# Patient Record
Sex: Male | Born: 1975
Health system: Southern US, Community
[De-identification: ages and names within clinical notes are randomized; demographics above are authoritative.]

## PROBLEM LIST (undated history)

## (undated) DIAGNOSIS — N201 Calculus of ureter: Secondary | ICD-10-CM

## (undated) DIAGNOSIS — F411 Generalized anxiety disorder: Secondary | ICD-10-CM

## (undated) DIAGNOSIS — F410 Panic disorder [episodic paroxysmal anxiety] without agoraphobia: Secondary | ICD-10-CM

## (undated) HISTORY — DX: Panic disorder (episodic paroxysmal anxiety): F41.0

## (undated) HISTORY — DX: Panic disorder (episodic paroxysmal anxiety): F41.1

---

## 2006-10-28 ENCOUNTER — Emergency Department (HOSPITAL_COMMUNITY): Admission: EM | Admit: 2006-10-28 | Discharge: 2006-10-28 | Payer: Self-pay | Admitting: Emergency Medicine

## 2009-10-30 ENCOUNTER — Ambulatory Visit: Payer: Self-pay | Admitting: Diagnostic Radiology

## 2009-10-30 ENCOUNTER — Emergency Department (HOSPITAL_BASED_OUTPATIENT_CLINIC_OR_DEPARTMENT_OTHER): Admission: EM | Admit: 2009-10-30 | Discharge: 2009-10-30 | Payer: Self-pay | Admitting: Emergency Medicine

## 2010-12-01 LAB — COMPREHENSIVE METABOLIC PANEL
AST: 21 U/L (ref 0–37)
BUN: 20 mg/dL (ref 6–23)
CO2: 28 mEq/L (ref 19–32)
Creatinine, Ser: 1.1 mg/dL (ref 0.4–1.5)
Potassium: 4.7 mEq/L (ref 3.5–5.1)

## 2010-12-01 LAB — DIFFERENTIAL
Basophils Absolute: 0 10*3/uL (ref 0.0–0.1)
Basophils Relative: 0 % (ref 0–1)
Eosinophils Absolute: 0 10*3/uL (ref 0.0–0.7)
Monocytes Absolute: 0.5 10*3/uL (ref 0.1–1.0)
Neutro Abs: 7.1 10*3/uL (ref 1.7–7.7)

## 2010-12-01 LAB — CBC
Hemoglobin: 16.3 g/dL (ref 13.0–17.0)
MCHC: 35.2 g/dL (ref 30.0–36.0)
MCV: 96.4 fL (ref 78.0–100.0)
Platelets: 258 10*3/uL (ref 150–400)
RBC: 4.8 MIL/uL (ref 4.22–5.81)
WBC: 8.8 10*3/uL (ref 4.0–10.5)

## 2010-12-01 LAB — LIPASE, BLOOD: Lipase: 140 U/L (ref 23–300)

## 2010-12-01 LAB — URINE MICROSCOPIC-ADD ON

## 2010-12-01 LAB — URINALYSIS, ROUTINE W REFLEX MICROSCOPIC
Nitrite: NEGATIVE
pH: 5.5 (ref 5.0–8.0)

## 2015-01-11 ENCOUNTER — Ambulatory Visit: Payer: Self-pay

## 2015-01-11 ENCOUNTER — Other Ambulatory Visit: Payer: Self-pay | Admitting: Occupational Medicine

## 2015-01-11 DIAGNOSIS — M25571 Pain in right ankle and joints of right foot: Secondary | ICD-10-CM

## 2016-05-18 DIAGNOSIS — H0015 Chalazion left lower eyelid: Secondary | ICD-10-CM | POA: Diagnosis not present

## 2016-06-08 DIAGNOSIS — H0015 Chalazion left lower eyelid: Secondary | ICD-10-CM | POA: Diagnosis not present

## 2016-11-17 DIAGNOSIS — N200 Calculus of kidney: Secondary | ICD-10-CM | POA: Diagnosis not present

## 2016-11-17 DIAGNOSIS — M545 Low back pain: Secondary | ICD-10-CM | POA: Diagnosis not present

## 2016-11-17 DIAGNOSIS — R319 Hematuria, unspecified: Secondary | ICD-10-CM | POA: Diagnosis not present

## 2016-11-17 MED FILL — HYDROCODON-APAP 5-325: 5-325 | 4 days supply | Qty: 30 | Fill #0

## 2016-11-17 MED FILL — TAMSULOSIN HCL 0.4 MG CAP: 0.4 | 15 days supply | Qty: 15 | Fill #0

## 2017-10-25 DIAGNOSIS — H00022 Hordeolum internum right lower eyelid: Secondary | ICD-10-CM | POA: Diagnosis not present

## 2018-01-28 DIAGNOSIS — F419 Anxiety disorder, unspecified: Secondary | ICD-10-CM | POA: Diagnosis not present

## 2018-01-28 DIAGNOSIS — F439 Reaction to severe stress, unspecified: Secondary | ICD-10-CM | POA: Diagnosis not present

## 2018-01-28 DIAGNOSIS — G4700 Insomnia, unspecified: Secondary | ICD-10-CM | POA: Diagnosis not present

## 2018-01-28 MED FILL — traZODone HCL 50 MG TABS: 50 | 30 days supply | Qty: 30 | Fill #0

## 2018-01-28 MED FILL — buPROPion HCL ER (XL) 150 M: 150 | 30 days supply | Qty: 30 | Fill #0

## 2018-02-25 DIAGNOSIS — F439 Reaction to severe stress, unspecified: Secondary | ICD-10-CM | POA: Diagnosis not present

## 2018-02-25 DIAGNOSIS — F419 Anxiety disorder, unspecified: Secondary | ICD-10-CM | POA: Diagnosis not present

## 2018-02-25 DIAGNOSIS — G4700 Insomnia, unspecified: Secondary | ICD-10-CM | POA: Diagnosis not present

## 2018-02-25 MED FILL — buPROPion HCL ER (XL) 150 M: 150 | 90 days supply | Qty: 90 | Fill #0

## 2018-02-25 MED FILL — traZODone HCL 50 MG TABS: 50 | 90 days supply | Qty: 90 | Fill #0

## 2018-03-03 ENCOUNTER — Observation Stay (HOSPITAL_COMMUNITY): Payer: 59

## 2018-03-03 ENCOUNTER — Other Ambulatory Visit: Payer: Self-pay

## 2018-03-03 ENCOUNTER — Observation Stay (HOSPITAL_COMMUNITY): Payer: 59 | Admitting: Certified Registered Nurse Anesthetist

## 2018-03-03 ENCOUNTER — Encounter (HOSPITAL_COMMUNITY)
Admission: EM | Disposition: A | Discharge status: Home / Self Care | Payer: Self-pay | Source: Home / Self Care | Attending: Urology

## 2018-03-03 ENCOUNTER — Encounter (HOSPITAL_BASED_OUTPATIENT_CLINIC_OR_DEPARTMENT_OTHER): Payer: Self-pay | Admitting: *Deleted

## 2018-03-03 ENCOUNTER — Observation Stay (HOSPITAL_BASED_OUTPATIENT_CLINIC_OR_DEPARTMENT_OTHER)
Admission: EM | Admit: 2018-03-03 | Discharge: 2018-03-03 | Disposition: A | Discharge status: Home / Self Care | Payer: 59 | Attending: Urology | Admitting: Urology

## 2018-03-03 ENCOUNTER — Emergency Department (HOSPITAL_BASED_OUTPATIENT_CLINIC_OR_DEPARTMENT_OTHER): Payer: 59

## 2018-03-03 DIAGNOSIS — N132 Hydronephrosis with renal and ureteral calculous obstruction: Principal | ICD-10-CM | POA: Insufficient documentation

## 2018-03-03 DIAGNOSIS — R1031 Right lower quadrant pain: Secondary | ICD-10-CM | POA: Diagnosis not present

## 2018-03-03 DIAGNOSIS — Z6830 Body mass index (BMI) 30.0-30.9, adult: Secondary | ICD-10-CM | POA: Insufficient documentation

## 2018-03-03 DIAGNOSIS — N201 Calculus of ureter: Secondary | ICD-10-CM | POA: Diagnosis present

## 2018-03-03 DIAGNOSIS — E669 Obesity, unspecified: Secondary | ICD-10-CM | POA: Diagnosis not present

## 2018-03-03 DIAGNOSIS — R109 Unspecified abdominal pain: Secondary | ICD-10-CM | POA: Diagnosis not present

## 2018-03-03 HISTORY — PX: CYSTOSCOPY/URETEROSCOPY/HOLMIUM LASER/STENT PLACEMENT: SHX6546

## 2018-03-03 HISTORY — DX: Calculus of ureter: N20.1

## 2018-03-03 LAB — URINALYSIS, ROUTINE W REFLEX MICROSCOPIC
BILIRUBIN URINE: NEGATIVE
Glucose, UA: NEGATIVE mg/dL
KETONES UR: NEGATIVE mg/dL
LEUKOCYTES UA: NEGATIVE
NITRITE: NEGATIVE
PROTEIN: NEGATIVE mg/dL
Specific Gravity, Urine: >1.03 — ABNORMAL HIGH (ref 1.005–1.030)
pH: 6 (ref 5.0–8.0)

## 2018-03-03 LAB — BASIC METABOLIC PANEL
ANION GAP: 10 (ref 5–15)
BUN: 19 mg/dL (ref 6–20)
CALCIUM: 9.3 mg/dL (ref 8.9–10.3)
CO2: 28 mmol/L (ref 22–32)
CREATININE: 1.31 mg/dL — AB (ref 0.61–1.24)
Chloride: 106 mmol/L (ref 101–111)
GFR calc Af Amer: >60 mL/min (ref >60)
Glucose, Bld: 143 mg/dL — ABNORMAL HIGH (ref 65–99)
Potassium: 4.5 mmol/L (ref 3.5–5.1)
Sodium: 144 mmol/L (ref 135–145)

## 2018-03-03 LAB — CBC WITH DIFFERENTIAL/PLATELET
BASOS ABS: 0 10*3/uL (ref 0.0–0.1)
BASOS PCT: 0 %
EOS ABS: 0.1 10*3/uL (ref 0.0–0.7)
EOS PCT: 0 %
HCT: 45.1 % (ref 39.0–52.0)
Hemoglobin: 15.9 g/dL (ref 13.0–17.0)
Lymphocytes Relative: 14 %
Lymphs Abs: 2.1 10*3/uL (ref 0.7–4.0)
MCH: 34 pg (ref 26.0–34.0)
MCHC: 35.3 g/dL (ref 30.0–36.0)
MCV: 96.4 fL (ref 78.0–100.0)
MONO ABS: 1 10*3/uL (ref 0.1–1.0)
Monocytes Relative: 6 %
NEUTROS ABS: 11.8 10*3/uL — AB (ref 1.7–7.7)
Neutrophils Relative %: 80 %
PLATELETS: 313 10*3/uL (ref 150–400)
RBC: 4.68 MIL/uL (ref 4.22–5.81)
RDW: 11.6 % (ref 11.5–15.5)
WBC: 14.9 10*3/uL — ABNORMAL HIGH (ref 4.0–10.5)

## 2018-03-03 LAB — URINALYSIS, MICROSCOPIC (REFLEX): BACTERIA UA: NONE SEEN

## 2018-03-03 LAB — SURGICAL PCR SCREEN
MRSA, PCR: NEGATIVE
Staphylococcus aureus: NEGATIVE

## 2018-03-03 SURGERY — CYSTOSCOPY/URETEROSCOPY/HOLMIUM LASER/STENT PLACEMENT
Anesthesia: General | Site: Ureter | Laterality: Left

## 2018-03-03 MED ORDER — MUPIROCIN 2 % EX OINT: 1.0000 "application " | TOPICAL_OINTMENT | Freq: Two times a day (BID) | CUTANEOUS | Status: DC

## 2018-03-03 MED ORDER — FENTANYL CITRATE (PF) 100 MCG/2ML IJ SOLN
50.0000 ug | Freq: Once | INTRAMUSCULAR | Status: AC
  Administered 2018-03-03: 50 ug via INTRAVENOUS
  Filled 2018-03-03: qty 2

## 2018-03-03 MED ORDER — KETOROLAC TROMETHAMINE 15 MG/ML IJ SOLN
15.0000 mg | Freq: Four times a day (QID) | INTRAMUSCULAR | Status: DC
  Administered 2018-03-03 (×2): 15 mg via INTRAVENOUS
  Filled 2018-03-03 (×2): qty 1

## 2018-03-03 MED ORDER — CEFAZOLIN SODIUM-DEXTROSE 2-4 GM/100ML-% IV SOLN
INTRAVENOUS | Status: AC
  Filled 2018-03-03: qty 100

## 2018-03-03 MED ORDER — OXYCODONE HCL 5 MG PO TABS: 5.0000 mg | ORAL_TABLET | ORAL | Status: DC | PRN

## 2018-03-03 MED ORDER — HYDROMORPHONE HCL 1 MG/ML IJ SOLN
1.0000 mg | Freq: Once | INTRAMUSCULAR | Status: AC
  Administered 2018-03-03: 1 mg via INTRAVENOUS
  Filled 2018-03-03: qty 1

## 2018-03-03 MED ORDER — CEFAZOLIN SODIUM-DEXTROSE 2-3 GM-%(50ML) IV SOLR
INTRAVENOUS | Status: DC | PRN
  Administered 2018-03-03: 2 g via INTRAVENOUS

## 2018-03-03 MED ORDER — LIDOCAINE 2% (20 MG/ML) 5 ML SYRINGE
INTRAMUSCULAR | Status: DC | PRN
  Administered 2018-03-03: 100 mg via INTRAVENOUS

## 2018-03-03 MED ORDER — ONDANSETRON HCL 4 MG/2ML IJ SOLN
INTRAMUSCULAR | Status: DC | PRN
Start: 2018-03-03 — End: 2018-03-03
  Administered 2018-03-03: 4 mg via INTRAVENOUS

## 2018-03-03 MED ORDER — DEXAMETHASONE SODIUM PHOSPHATE 10 MG/ML IJ SOLN
INTRAMUSCULAR | Status: DC | PRN
  Administered 2018-03-03: 10 mg via INTRAVENOUS

## 2018-03-03 MED ORDER — SENNOSIDES-DOCUSATE SODIUM 8.6-50 MG PO TABS: 1.0000 | ORAL_TABLET | Freq: Two times a day (BID) | ORAL | 0 refills | Status: DC

## 2018-03-03 MED ORDER — OXYCODONE-ACETAMINOPHEN 5-325 MG PO TABS: 1.0000 | ORAL_TABLET | ORAL | 0 refills | Status: AC | PRN

## 2018-03-03 MED ORDER — HYDROMORPHONE HCL 1 MG/ML IJ SOLN: 0.2500 mg | INTRAMUSCULAR | Status: DC | PRN

## 2018-03-03 MED ORDER — ONDANSETRON HCL 4 MG/2ML IJ SOLN
4.0000 mg | Freq: Once | INTRAMUSCULAR | Status: AC
  Administered 2018-03-03: 4 mg via INTRAVENOUS
  Filled 2018-03-03: qty 2

## 2018-03-03 MED ORDER — SUGAMMADEX SODIUM 200 MG/2ML IV SOLN
INTRAVENOUS | Status: DC | PRN
  Administered 2018-03-03: 200 mg via INTRAVENOUS

## 2018-03-03 MED ORDER — PROPOFOL 10 MG/ML IV BOLUS
INTRAVENOUS | Status: AC
  Filled 2018-03-03: qty 20

## 2018-03-03 MED ORDER — ACETAMINOPHEN 500 MG PO TABS
1000.0000 mg | ORAL_TABLET | Freq: Three times a day (TID) | ORAL | Status: DC
  Administered 2018-03-03: 1000 mg via ORAL
  Filled 2018-03-03: qty 2

## 2018-03-03 MED ORDER — DEXAMETHASONE SODIUM PHOSPHATE 10 MG/ML IJ SOLN
INTRAMUSCULAR | Status: AC
  Filled 2018-03-03: qty 1

## 2018-03-03 MED ORDER — LACTATED RINGERS IV SOLN
INTRAVENOUS | Status: DC
  Administered 2018-03-03: 11:00:00 via INTRAVENOUS

## 2018-03-03 MED ORDER — SUCCINYLCHOLINE CHLORIDE 200 MG/10ML IV SOSY
PREFILLED_SYRINGE | INTRAVENOUS | Status: DC | PRN
  Administered 2018-03-03: 120 mg via INTRAVENOUS

## 2018-03-03 MED ORDER — ONDANSETRON HCL 4 MG/2ML IJ SOLN
INTRAMUSCULAR | Status: AC
  Filled 2018-03-03: qty 2

## 2018-03-03 MED ORDER — SODIUM CHLORIDE 0.9 % IR SOLN
Status: DC | PRN
  Administered 2018-03-03 (×2): 3000 mL

## 2018-03-03 MED ORDER — FENTANYL CITRATE (PF) 250 MCG/5ML IJ SOLN
INTRAMUSCULAR | Status: AC
  Filled 2018-03-03: qty 5

## 2018-03-03 MED ORDER — CEPHALEXIN 500 MG PO CAPS: 500.0000 mg | ORAL_CAPSULE | Freq: Two times a day (BID) | ORAL | 0 refills | Status: DC

## 2018-03-03 MED ORDER — SODIUM CHLORIDE 0.9 % IV SOLN
INTRAVENOUS | Status: DC | PRN
  Administered 2018-03-03: 10 mL

## 2018-03-03 MED ORDER — MIDAZOLAM HCL 2 MG/2ML IJ SOLN
INTRAMUSCULAR | Status: AC
  Filled 2018-03-03: qty 2

## 2018-03-03 MED ORDER — FENTANYL CITRATE (PF) 100 MCG/2ML IJ SOLN
INTRAMUSCULAR | Status: DC | PRN
  Administered 2018-03-03 (×3): 50 ug via INTRAVENOUS

## 2018-03-03 MED ORDER — KCL IN DEXTROSE-NACL 20-5-0.45 MEQ/L-%-% IV SOLN
INTRAVENOUS | Status: DC
  Administered 2018-03-03: 07:00:00 via INTRAVENOUS
  Filled 2018-03-03: qty 1000

## 2018-03-03 MED ORDER — PROPOFOL 10 MG/ML IV BOLUS
INTRAVENOUS | Status: DC | PRN
  Administered 2018-03-03: 200 mg via INTRAVENOUS

## 2018-03-03 MED ORDER — HYDROMORPHONE HCL 1 MG/ML IJ SOLN: 0.5000 mg | INTRAMUSCULAR | Status: DC | PRN

## 2018-03-03 MED ORDER — LIDOCAINE 2% (20 MG/ML) 5 ML SYRINGE
INTRAMUSCULAR | Status: AC
  Filled 2018-03-03: qty 5

## 2018-03-03 MED ORDER — SENNOSIDES-DOCUSATE SODIUM 8.6-50 MG PO TABS: 1.0000 | ORAL_TABLET | Freq: Two times a day (BID) | ORAL | Status: DC

## 2018-03-03 MED ORDER — ONDANSETRON HCL 4 MG/2ML IJ SOLN: 4.0000 mg | Freq: Three times a day (TID) | INTRAMUSCULAR | Status: AC | PRN

## 2018-03-03 MED ORDER — ROCURONIUM BROMIDE 50 MG/5ML IV SOSY
PREFILLED_SYRINGE | INTRAVENOUS | Status: DC | PRN
  Administered 2018-03-03: 40 mg via INTRAVENOUS
  Administered 2018-03-03: 10 mg via INTRAVENOUS
  Administered 2018-03-03: 20 mg via INTRAVENOUS
  Administered 2018-03-03: 10 mg via INTRAVENOUS

## 2018-03-03 MED ORDER — KETOROLAC TROMETHAMINE 10 MG PO TABS: 10.0000 mg | ORAL_TABLET | Freq: Four times a day (QID) | ORAL | 0 refills | Status: DC | PRN

## 2018-03-03 MED ORDER — PROMETHAZINE HCL 25 MG/ML IJ SOLN: 6.2500 mg | INTRAMUSCULAR | Status: DC | PRN

## 2018-03-03 MED ORDER — SODIUM CHLORIDE 0.9 % IV SOLN
INTRAVENOUS | Status: AC
  Administered 2018-03-03: 05:00:00 via INTRAVENOUS

## 2018-03-03 MED ORDER — HYDROMORPHONE HCL 1 MG/ML IJ SOLN
1.0000 mg | INTRAMUSCULAR | Status: AC | PRN
  Administered 2018-03-03 (×3): 1 mg via INTRAVENOUS
  Filled 2018-03-03 (×4): qty 1

## 2018-03-03 SURGICAL SUPPLY — 26 items
BAG URO CATCHER STRL LF (MISCELLANEOUS) ×3 IMPLANT
BASKET LASER NITINOL 1.9FR (BASKET) ×3 IMPLANT
CATH INTERMIT  6FR 70CM (CATHETERS) ×3 IMPLANT
CLOTH BEACON ORANGE TIMEOUT ST (SAFETY) ×3 IMPLANT
COVER FOOTSWITCH UNIV (MISCELLANEOUS) ×3 IMPLANT
COVER SURGICAL LIGHT HANDLE (MISCELLANEOUS) ×3 IMPLANT
EXTRACTOR STONE 1.7FRX115CM (UROLOGICAL SUPPLIES) IMPLANT
FIBER LASER FLEXIVA 1000 (UROLOGICAL SUPPLIES) IMPLANT
FIBER LASER FLEXIVA 365 (UROLOGICAL SUPPLIES) IMPLANT
FIBER LASER FLEXIVA 550 (UROLOGICAL SUPPLIES) IMPLANT
FIBER LASER TRAC TIP (UROLOGICAL SUPPLIES) ×3 IMPLANT
GLOVE BIOGEL M STRL SZ7.5 (GLOVE) ×3 IMPLANT
GOWN STRL REUS W/TWL LRG LVL3 (GOWN DISPOSABLE) ×6 IMPLANT
GUIDEWIRE ANG ZIPWIRE 038X150 (WIRE) ×3 IMPLANT
GUIDEWIRE STR DUAL SENSOR (WIRE) ×3 IMPLANT
IV NS 1000ML (IV SOLUTION) ×2
IV NS 1000ML BAXH (IV SOLUTION) ×1 IMPLANT
MANIFOLD NEPTUNE II (INSTRUMENTS) ×3 IMPLANT
PACK CYSTO (CUSTOM PROCEDURE TRAY) ×3 IMPLANT
SHEATH URETERAL 12FRX28CM (UROLOGICAL SUPPLIES) IMPLANT
SHEATH URETERAL 12FRX35CM (MISCELLANEOUS) ×3 IMPLANT
STENT POLARIS 5FRX26 (STENTS) ×3 IMPLANT
SYR CONTROL 10ML LL (SYRINGE) ×3 IMPLANT
TUBE FEEDING 8FR 16IN STR KANG (MISCELLANEOUS) ×3 IMPLANT
TUBING CONNECTING 10 (TUBING) ×2 IMPLANT
TUBING CONNECTING 10' (TUBING) ×1

## 2018-03-03 NOTE — ED Provider Notes (Signed)
[facility] Provider Note: Lowella Dell, MD, FACEP  CSN: [id number] MRN: [id number] ARRIVAL: 03/03/18 at 0143 ROOM: MH02/MH02   CHIEF COMPLAINT  Flank Pain   HISTORY OF PRESENT ILLNESS  03/03/18 2:20 AM Randy Scott is a 42 y.o. male with a history of kidney stones.  He is here with left flank pain that began about 2 weeks ago off and on but acutely worsened yesterday evening at 10 PM.  He rates his pain as severe and characterizes it is like previous kidney stones.  The pain is not made better or worse with movement or palpation.  He took a Vicodin prior to arrival but vomited it up.  He has had multiple episodes of emesis since the onset of the pain.  He has had increased urinary frequency with passage of small amounts.  He has not noticed blood in his urine.  He has not had a fever.  He was given Zofran 4 mg IV and fentanyl 50 mcg IV prior to my evaluation with out adequate relief of his pain.   Past Medical History:  Diagnosis Date  . Ureterolithiasis     History reviewed. No pertinent surgical history.  History reviewed. No pertinent family history.  Social History   Tobacco Use  . Smoking status: Never Smoker  . Smokeless tobacco: Never Used  Substance Use Topics  . Alcohol use: Yes    Frequency: Never  . Drug use: Never    Prior to Admission medications   Not on File    Allergies Patient has no known allergies.   REVIEW OF SYSTEMS  Negative except as noted here or in the History of Present Illness.   PHYSICAL EXAMINATION  Initial Vital Signs Blood pressure (!) 152/76, pulse (!) 55, temperature 98 F (36.7 C), temperature source Oral, resp. rate 18, height 6\' 2"  (1.88 m), weight 104.3 kg (230 lb), SpO2 100 %.  Examination General: Well-developed, well-nourished male in no acute distress; appearance consistent with age of record HENT: normocephalic; atraumatic Eyes: pupils equal, round and reactive to light; extraocular muscles  intact Neck: supple Heart: regular rate and rhythm Lungs: clear to auscultation bilaterally Abdomen: soft; nondistended; nontender; bowel sounds present GU: No CVA tenderness Extremities: No deformity; full range of motion; pulses normal Neurologic: Awake, alert and oriented; motor function intact in all extremities and symmetric; no facial droop Skin: Warm and dry Psychiatric: Grimacing   RESULTS  Summary of this visit's results, reviewed by myself:   EKG Interpretation  Date/Time:    Ventricular Rate:    PR Interval:    QRS Duration:   QT Interval:    QTC Calculation:   R Axis:     Text Interpretation:        Laboratory Studies: Results for orders placed or performed during the hospital encounter of 03/03/18 (from the past 24 hour(s))  Basic metabolic panel     Status: Abnormal   Collection Time: 03/03/18  1:58 AM  Result Value Ref Range   Sodium 144 135 - 145 mmol/L   Potassium 4.5 3.5 - 5.1 mmol/L   Chloride 106 101 - 111 mmol/L   CO2 28 22 - 32 mmol/L   Glucose, Bld 143 (H) 65 - 99 mg/dL   BUN 19 6 - 20 mg/dL   Creatinine, Ser 7.82 (H) 0.61 - 1.24 mg/dL   Calcium 9.3 8.9 - 95.6 mg/dL   GFR calc non Af Amer >60 >60 mL/min   GFR calc Af Amer >60 >60  mL/min   Anion gap 10 5 - 15  CBC with Differential     Status: Abnormal   Collection Time: 03/03/18  1:58 AM  Result Value Ref Range   WBC 14.9 (H) 4.0 - 10.5 K/uL   RBC 4.68 4.22 - 5.81 MIL/uL   Hemoglobin 15.9 13.0 - 17.0 g/dL   HCT 09.845.1 11.939.0 - 14.752.0 %   MCV 96.4 78.0 - 100.0 fL   MCH 34.0 26.0 - 34.0 pg   MCHC 35.3 30.0 - 36.0 g/dL   RDW 82.911.6 56.211.5 - 13.015.5 %   Platelets 313 150 - 400 K/uL   Neutrophils Relative % 80 %   Neutro Abs 11.8 (H) 1.7 - 7.7 K/uL   Lymphocytes Relative 14 %   Lymphs Abs 2.1 0.7 - 4.0 K/uL   Monocytes Relative 6 %   Monocytes Absolute 1.0 0.1 - 1.0 K/uL   Eosinophils Relative 0 %   Eosinophils Absolute 0.1 0.0 - 0.7 K/uL   Basophils Relative 0 %   Basophils Absolute 0.0 0.0 - 0.1  K/uL  Urinalysis, Routine w reflex microscopic     Status: Abnormal   Collection Time: 03/03/18  1:58 AM  Result Value Ref Range   Color, Urine YELLOW YELLOW   APPearance CLEAR CLEAR   Specific Gravity, Urine >1.030 (H) 1.005 - 1.030   pH 6.0 5.0 - 8.0   Glucose, UA NEGATIVE NEGATIVE mg/dL   Hgb urine dipstick SMALL (A) NEGATIVE   Bilirubin Urine NEGATIVE NEGATIVE   Ketones, ur NEGATIVE NEGATIVE mg/dL   Protein, ur NEGATIVE NEGATIVE mg/dL   Nitrite NEGATIVE NEGATIVE   Leukocytes, UA NEGATIVE NEGATIVE  Urinalysis, Microscopic (reflex)     Status: None   Collection Time: 03/03/18  1:58 AM  Result Value Ref Range   RBC / HPF 6-10 0 - 5 RBC/hpf   WBC, UA 0-5 0 - 5 WBC/hpf   Bacteria, UA NONE SEEN NONE SEEN   Squamous Epithelial / LPF 0-5 0 - 5   Mucus PRESENT    Imaging Studies: Ct Renal Stone Study  Result Date: 03/03/2018 CLINICAL DATA:  Intermittent left flank pain EXAM: CT ABDOMEN AND PELVIS WITHOUT CONTRAST TECHNIQUE: Multidetector CT imaging of the abdomen and pelvis was performed following the standard protocol without IV contrast. COMPARISON:  CT 10/30/2009 FINDINGS: Lower chest: No acute abnormality. Hepatobiliary: No focal liver abnormality is seen. No gallstones, gallbladder wall thickening, or biliary dilatation. Pancreas: Unremarkable. No pancreatic ductal dilatation or surrounding inflammatory changes. Spleen: Normal in size without focal abnormality. Adrenals/Urinary Tract: Adrenal glands are normal. No right hydronephrosis. Moderate left hydronephrosis and hydroureter, secondary to a 3 mm stone in the distal left ureter just proximal to the left UVJ. Additional 11 mm stone in the dilated left renal pelvis. Stomach/Bowel: Stomach is within normal limits. Appendix appears normal. No evidence of bowel wall thickening, distention, or inflammatory changes. Vascular/Lymphatic: Minimal aortic atherosclerosis. No aneurysmal dilatation. No significant adenopathy Reproductive: Prostate  is unremarkable. Other: Negative for free air or free fluid. Musculoskeletal: No acute or significant osseous findings. IMPRESSION: 1. Moderate left hydronephrosis and hydroureter, secondary to a 3 mm stone in the distal left ureter just proximal to the left UVJ. 2. 11 mm stone in the left renal pelvis. Electronically Signed   By: Jasmine PangKim  Fujinaga M.D.   On: 03/03/2018 03:04    ED COURSE and MDM  Nursing notes and initial vitals signs, including pulse oximetry, reviewed.  Vitals:   03/03/18 0147 03/03/18 0148 03/03/18 0301 03/03/18 0330  BP: (!) 152/76  (!) 150/84   Pulse: (!) 55  62 84  Resp: 18  14   Temp: 98 F (36.7 C)     TempSrc: Oral     SpO2: 100%  97% 99%  Weight:  104.3 kg (230 lb)    Height:  6\' 2"  (1.88 m)     3:27 AM Patient has been given 100 mcg of fentanyl and 2 mg of Dilaudid.  He is somnolent but complains of continued pain.  Given his persistent pain and complex pathology as shown on CT we will contact urology.  3:47 AM Dr. Berneice Heinrich accepts patient for transfer to [facility].  He plans to perform surgery tomorrow.  PROCEDURES   CRITICAL CARE Performed by: Paula Libra L Total critical care time: 30 minutes Critical care time was exclusive of separately billable procedures and treating other patients. Critical care was necessary to treat or prevent imminent or life-threatening deterioration. Critical care was time spent personally by me on the following activities: development of treatment plan with patient and/or surrogate as well as nursing, discussions with consultants, evaluation of patient's response to treatment, examination of patient, obtaining history from patient or surrogate, ordering and performing treatments and interventions, ordering and review of laboratory studies, ordering and review of radiographic studies, pulse oximetry and re-evaluation of patient's condition.   ED DIAGNOSES     ICD-10-CM   1. Ureterolithiasis N20.1        Shakima Nisley,  MD 03/03/18 [phone]

## 2018-03-03 NOTE — Brief Op Note (Signed)
03/03/2018  1:30 PM  PATIENT:  Randy Scott  42 y.o. male  PRE-OPERATIVE DIAGNOSIS:  left ureteral stone  POST-OPERATIVE DIAGNOSIS:  left ureteral stone  PROCEDURE:  Procedure(s): CYSTOSCOPY/URETEROSCOPY/HOLMIUM LASER/STONE REMOVAL WITH BASKET/STENT PLACEMENT (Left)  SURGEON:  Surgeon(s) and Role:    Sebastian Ache* Keyly Baldonado, MD - Primary  PHYSICIAN ASSISTANT:   ASSISTANTS: none   ANESTHESIA:   general  EBL:  minimal   BLOOD ADMINISTERED:none  DRAINS: none   LOCAL MEDICATIONS USED:  NONE  SPECIMEN:  Source of Specimen:  LEFT ureteral / renal stone fragments  DISPOSITION OF SPECIMEN:  [facility] for compositional analysis  COUNTS:  YES  TOURNIQUET:  * No tourniquets in log *  DICTATION: .Other Dictation: Dictation Number [id number]  PLAN OF CARE: Admit for overnight observation  PATIENT DISPOSITION:  PACU - hemodynamically stable.   Delay start of Pharmacological VTE agent (>24hrs) due to surgical blood loss or risk of bleeding: yes

## 2018-03-03 NOTE — Op Note (Signed)
NAME: Randy Scott, HANDLER MEDICAL RECORD ZO:[id number] ACCOUNT [id number] DATE OF BIRTH:1976/01/05 FACILITY: [facility] LOCATION: [facility] PHYSICIAN:Jasani Lengel, MD  OPERATIVE REPORT  DATE OF PROCEDURE:  03/03/2018  PREOPERATIVE  DIAGNOSIS:  Left ureteral and renal stone refractory colic.  PROCEDURE: 1.  Cystoscopy, left retrograde pyelogram, interpretation. 2.  Left ureteroscopy with laser lithotripsy and extraction of left ureteral stent 5 x 26 Polaris with tether.  ESTIMATED BLOOD LOSS:  Nil.  COMPLICATIONS:  None.  SPECIMENS:  Left ureteral and renal stone fragments for composition analysis.  FINDINGS: 1.  Left distal ureteral stone with moderate hydronephrosis above this. 2.  Left renal pelvis stone. 3.  Complete resolution of all accessible stone fragments larger than 130 mm following laser lithotripsy and basket extraction. 4.  Successful placement of left ureteral stent proximally in the renal pelvis, distally in the urinary bladder.  INDICATIONS:  The patient is a 42 year old gentleman with a history of recurrent nephrolithiasis previously managed medically.  He was found on workup for colicky flank pain to have a left distal ureteral stone.  It was only around 3 mm or so but with  significant hydronephrosis, as well as an 11 mm left intrarenal stone, nonobstructing.  His colic was quite severe, unable to be controlled with oral medicines.  Therefore, he was admitted overnight for observation.  He failed to pass the stone by the  morning.  Options were discussed including continued medical therapy versus proceeding with active treatment and to proceed with active treatment with ureteroscopy with goal left side stone free.  Informed consent was obtained and placed in the medical  record.  PROCEDURE IN DETAIL:  The patient was identified, procedure being left ureteroscopy with stent placement was confirmed.  Procedure timeout was performed.  Intravenous Ancef was administered.  General  anesthesia introduced.  The patient was placed into a  low-lithotomy position.  A sterile field was created by prepping and draping his penis, perineum, and proximal thighs using iodine.  Next, cystourethroscopy was performed with a 20-French rigid cystoscope offset lens.  Dissection of anterior and  posterior urethra was unremarkable.  Inspection of the bladder revealed no diverticula, calcifications, or palpable lesions.  The left ureteral orifice was cannulated with a 6-French Foley catheter, and a left retrograde pyelogram was obtained.  Left retrograde pyelogram demonstrated a single left ureter with single-system left kidney.  There was a filling defect in the distal ureter consistent with known stone.  There was moderate hydroureteronephrosis above this.  A 0.038 ZIPwire was advanced  slowly to the level of the upper pole and set aside the safety wire.  An 8-French feeding tube was placed in the urinary bladder and pressure released, and a semirigid ureteroscopy was performed of the distal left ureter alongside the sensor working  wire.  As expected, there was a free floating left distal ureteral stone just above the level of the intramural ureter.  It was very ovoid.  It did appear amenable to simple basketing.  It was grasped in its long axis, removed entirely, and set aside for  composition analysis.  Repeat semirigid ureteroscopy of the distal orifice.  The ureter revealed no additional calcifications or mucosal abnormalities as the goal today was left side stone free.  The semirigid scope was exchanged over the sensor working  wire for a 12/14, 38 cm ureteral access sheath.  Using continuous fluoroscopic guidance to the level of the proximal left ureter, a systematic inspection was performed of the proximal left ureter and left kidney, including  all calices x3.  Dominant  calcification was noted in the renal pelvis.  This appeared to be much too large for simple basketing.  This was the only  stone within the kidney.  This was repositioned into an upper pole calix, and holmium laser energy was applied to the stone using  settings of 0.2 joules and 20 Hz, and approximately 60% to 70% of the stone was ablated using a dusting technique.  The remaining 30% to 40% of the stone was fragmented into pieces approximately 1-2 mm.  These were then sequentially grasped with an  escape basket and removed and set aside for composition analysis.  Following these maneuvers, excellent hemostasis.  No evidence of any renal perforation.  There was complete resolution of all stone fragments larger than 1/3 mm.  The access sheath was  removed under continuous vision.  No significant mucosal abnormalities were found.  Given access sheath usage, it was felt that brief interval stenting would be warranted with a tethered stent after which a new a 5 x 26 Polaris-type stent was placed over  the safety wire using fluoroscopic guidance.  Good proximal and distal points were noted.  Tether was left in place and fashioned to the dorsum of the penis.  The procedure was terminated.  The patient tolerated the procedure well.  No immediate  perioperative complications.  The patient was taken to postanesthesia care in stable condition with plan for likely discharge later today.  LN/NUANCE  D:03/03/2018 T:03/03/2018 JOB:[id number]

## 2018-03-03 NOTE — ED Triage Notes (Signed)
C/o L flank pain, on and off for 2 weeks, worsening last night at 2200. Pt restless/ writhing at this time. Took a vicodin PTA but vomited it up. Reports NV frequently since 2200. Also reports frequency, decreased output. H/o kidney stones. Has seen a GU MD in the past, denies procedures. [facility]   Alert, NAD, restless, interactive, resps e/u, speaking in clear complete sentences, no dyspnea noted, skin warm and clammy, VSS, (denies: fever, diarrhea, obvious bleeding). [facility]

## 2018-03-03 NOTE — H&P (Signed)
Randy Scott is an 42 y.o. male.    Chief Complaint: Left Ureteral Stone With Refractory Colic  HPI:   1 - Recurrent Nephrolithiasis -  Pre 2019 - medical passage x "many" 02/2018 - 30m left UVJ sotne with mod hydro + 167mleft renal pelvis stone by ER CT 02/2018. Larger stone 1116m500HU, SSD 10cm. No fevers. Cr 1.3. UA without infectious parameters.  PMH unremarkable. No ischemic CV disease / blood thinners. No prior surgery. No PCP.   Today "Randy Scott seen for admission for left ureteral stone as his pain is refractory. Last meal >8 hours ago.   Past Medical History:  Diagnosis Date  . Ureterolithiasis     History reviewed. No pertinent surgical history.  History reviewed. No pertinent family history. Social History:  reports that he has never smoked. He has never used smokeless tobacco. He reports that he drinks alcohol. He reports that he does not use drugs.  Allergies: No Known Allergies   (Not in a hospital admission)  Results for orders placed or performed during the hospital encounter of 03/03/18 (from the past 48 hour(s))  Basic metabolic panel     Status: Abnormal   Collection Time: 03/03/18  1:58 AM  Result Value Ref Range   Sodium 144 135 - 145 mmol/L   Potassium 4.5 3.5 - 5.1 mmol/L   Chloride 106 101 - 111 mmol/L   CO2 28 22 - 32 mmol/L   Glucose, Bld 143 (H) 65 - 99 mg/dL   BUN 19 6 - 20 mg/dL   Creatinine, Ser 1.31 (H) 0.61 - 1.24 mg/dL   Calcium 9.3 8.9 - 10.3 mg/dL   GFR calc non Af Amer >60 >60 mL/min   GFR calc Af Amer >60 >60 mL/min    Comment: (NOTE) The eGFR has been calculated using the CKD EPI equation. This calculation has not been validated in all clinical situations. eGFR's persistently <60 mL/min signify possible Chronic Kidney Disease.    Anion gap 10 5 - 15    Comment: [facility]formed [facility] [place] [id number] with Differential     Status: Abnormal   Collection Time: 03/03/18  1:58 AM  Result  Value Ref Range   WBC 14.9 (H) 4.0 - 10.5 K/uL   RBC 4.68 4.22 - 5.81 MIL/uL   Hemoglobin 15.9 13.0 - 17.0 g/dL   HCT 45.1 39.0 - 52.0 %   MCV 96.4 78.0 - 100.0 fL   MCH 34.0 26.0 - 34.0 pg   MCHC 35.3 30.0 - 36.0 g/dL   RDW 11.6 11.5 - 15.5 %   Platelets 313 150 - 400 K/uL   Neutrophils Relative % 80 %   Neutro Abs 11.8 (H) 1.7 - 7.7 K/uL   Lymphocytes Relative 14 %   Lymphs Abs 2.1 0.7 - 4.0 K/uL   Monocytes Relative 6 %   Monocytes Absolute 1.0 0.1 - 1.0 K/uL   Eosinophils Relative 0 %   Eosinophils Absolute 0.1 0.0 - 0.7 K/uL   Basophils Relative 0 %   Basophils Absolute 0.0 0.0 - 0.1 K/uL    Comment: [facility]formed at [facility] [place]rinalysis, Routine w reflex microscopic     Status: Abnormal   Collection Time: 03/03/18  1:58 AM  Result Value Ref Range   Color, Urine YELLOW YELLOW   APPearance CLEAR CLEAR   Specific Gravity, Urine >1.030 (H) 1.005 -  1.030   pH 6.0 5.0 - 8.0   Glucose, UA NEGATIVE NEGATIVE mg/dL   Hgb urine dipstick SMALL (A) NEGATIVE   Bilirubin Urine NEGATIVE NEGATIVE   Ketones, ur NEGATIVE NEGATIVE mg/dL   Protein, ur NEGATIVE NEGATIVE mg/dL   Nitrite NEGATIVE NEGATIVE   Leukocytes, UA NEGATIVE NEGATIVE    Comment: Performed at [facility], [address] [place], [place] [address]  Urinalysis, Microscopic (reflex)     Status: None   Collection Time: 03/03/18  1:58 AM  Result Value Ref Range   RBC / HPF 6-10 0 - 5 RBC/hpf   WBC, UA 0-5 0 - 5 WBC/hpf   Bacteria, UA NONE SEEN NONE SEEN   Squamous Epithelial / LPF 0-5 0 - 5   Mucus PRESENT     Comment: Performed at [facility], [address] [place], [place] [address]   Ct Renal Stone Study  Result Date: 03/03/2018 CLINICAL DATA:  Intermittent left flank pain EXAM: CT ABDOMEN AND PELVIS WITHOUT CONTRAST TECHNIQUE: Multidetector CT imaging of the abdomen and pelvis was performed following the standard protocol without IV  contrast. COMPARISON:  CT 10/30/2009 FINDINGS: Lower chest: No acute abnormality. Hepatobiliary: No focal liver abnormality is seen. No gallstones, gallbladder wall thickening, or biliary dilatation. Pancreas: Unremarkable. No pancreatic ductal dilatation or surrounding inflammatory changes. Spleen: Normal in size without focal abnormality. Adrenals/Urinary Tract: Adrenal glands are normal. No right hydronephrosis. Moderate left hydronephrosis and hydroureter, secondary to a 3 mm stone in the distal left ureter just proximal to the left UVJ. Additional 11 mm stone in the dilated left renal pelvis. Stomach/Bowel: Stomach is within normal limits. Appendix appears normal. No evidence of bowel wall thickening, distention, or inflammatory changes. Vascular/Lymphatic: Minimal aortic atherosclerosis. No aneurysmal dilatation. No significant adenopathy Reproductive: Prostate is unremarkable. Other: Negative for free air or free fluid. Musculoskeletal: No acute or significant osseous findings. IMPRESSION: 1. Moderate left hydronephrosis and hydroureter, secondary to a 3 mm stone in the distal left ureter just proximal to the left UVJ. 2. 11 mm stone in the left renal pelvis. Electronically Signed   By: Donavan Foil M.D.   On: 03/03/2018 03:04    Review of Systems  Constitutional: Negative.  Negative for chills and fever.  HENT: Negative.   Eyes: Negative.   Respiratory: Negative.   Cardiovascular: Negative.   Gastrointestinal: Negative.   Genitourinary: Positive for flank pain and urgency.  Skin: Negative.   Neurological: Negative.   Endo/Heme/Allergies: Negative.   Psychiatric/Behavioral: Negative.     Blood pressure (!) 150/84, pulse 84, temperature 98 F (36.7 C), temperature source Oral, resp. rate 14, height '6\' 2"'  (1.88 m), weight 104.3 kg (230 lb), SpO2 99 %. Physical Exam  Constitutional: He appears well-developed.  HENT:  Head: Normocephalic.  Neck: Normal range of motion.  Cardiovascular:  Normal rate.  Respiratory: Effort normal.  GI: Soft.  Genitourinary:  Genitourinary Comments: Moderate left CVAT at present  Musculoskeletal: Normal range of motion.  Neurological: He is alert.  Skin: Skin is warm.  Psychiatric: He has a normal mood and affect.     Assessment/Plan   Discussed options of medical therapy  V. SWL, v. Ureteroscopy and he adamantly wants to proceed with left ureteroscopy with goal of left side stone free. Risks, benefits, alternatives, expected peri-op course discussed.     Alexis Frock, MD 03/03/2018, 3:45 AM

## 2018-03-03 NOTE — Discharge Summary (Signed)
Physician Discharge Summary  Patient ID: Randy Scott MRN: [id number] DOB/AGE: 40-04-1976 40 y.o.  Admit date: 03/03/2018 Discharge date: 03/03/2018  Admission Diagnoses: Left Ureteral Stone with Refracotry Colic  Discharge Diagnoses:  Active Problems:   Ureterolithiasis   Ureteral stone with hydronephrosis   Discharged Condition: good  Hospital Course: Pt admitted directly from [facility] ER for left distal ureteral stone + renal stone with refracotry colic. He underwent left ureteroscopy wwith laser lithotripsy and tethered ureteral stent placement later the day of admission. By the afternoon after surgery he is ambulatory, pain controlled, maintaining PO hydration, and felt to be adequate for discharge.   Consults: None  Significant Diagnostic Studies: labs: stone composition - pending  Treatments: surgery: as per above  Discharge Exam: Blood pressure 140/87, pulse 80, temperature 98.6 F (37 C), resp. rate 12, height 6\' 2"  (1.88 m), weight 107.2 kg (236 lb 4.8 oz), SpO2 96 %. see H+P from same date.   Disposition:     Follow-up Information    Sebastian AcheManny, Cailah Reach, MD Follow up.   Specialty:  Urology Why:  Office will call to arrange follow up.  Contact information: [address] [place] [id number] [phone]           Signed: Sebastian AcheMANNY, Marqueta Pulley 03/03/2018, 4:47 PM

## 2018-03-03 NOTE — Anesthesia Procedure Notes (Signed)
Procedure Name: Intubation Date/Time: 03/03/2018 12:29 PM Performed by: West Pugh, CRNA Pre-anesthesia Checklist: Patient identified, Emergency Drugs available, Suction available, Patient being monitored and Timeout performed Patient Re-evaluated:Patient Re-evaluated prior to induction Oxygen Delivery Method: Circle system utilized Preoxygenation: Pre-oxygenation with 100% oxygen Induction Type: IV induction, Rapid sequence and Cricoid Pressure applied Laryngoscope Size: Mac and 4 Grade View: Grade II Tube type: Oral Number of attempts: 1 Airway Equipment and Method: Stylet Placement Confirmation: ETT inserted through vocal cords under direct vision,  positive ETCO2,  CO2 detector and breath sounds checked- equal and bilateral Secured at: 22 cm Tube secured with: Tape Dental Injury: Teeth and Oropharynx as per pre-operative assessment

## 2018-03-03 NOTE — ED Notes (Signed)
Back from CT, resting, calm, interactive, NAD. [facility]

## 2018-03-03 NOTE — ED Notes (Signed)
ED TO INPATIENT HANDOFF REPORT  Name/Age/Gender Randy Scott 42 y.o. male  Code Status   Home/SNF/Other Home  Chief Complaint FLANK PAIN  Level of Care/Admitting Diagnosis ED Disposition    ED Disposition Condition Comment   Transfer to Another [place]: [facility] [[id number]] Level of Care: Med-Surg [16] Diagnosis: Ureterolithiasis [157262] Admitting Physician: Alexis Frock [phone] Attending Physician: Alexis Frock [phone] Estimated length of stay: inpatient  only procedure Certification:: I certify this patient is being admitted for an inpatient-only procedure PT Class (Do Not Modify): Inpatient [101] PT Acc Code (Do Not Modify): Private [1]       Medical History Past Medical History:  Diagnosis Date  . Ureterolithiasis     Allergies No Known Allergies  IV Location/Drains/Wounds Patient Lines/Drains/Airways Status   Active Line/Drains/Airways    Name:   Placement date:   Placement time:   Site:   Days:   Peripheral IV 03/03/18 Right;Lateral Antecubital   03/03/18    0149    Antecubital   less than 1          Labs/Imaging Results for orders placed or performed during the hospital encounter of 03/03/18 (from the past 48 hour(s))  Basic metabolic panel     Status: Abnormal   Collection Time: 03/03/18  1:58 AM  Result Value Ref Range   Sodium 144 135 - 145 mmol/L   Potassium 4.5 3.5 - 5.1 mmol/L   Chloride 106 101 - 111 mmol/L   CO2 28 22 - 32 mmol/L   Glucose, Bld 143 (H) 65 - 99 mg/dL   BUN 19 6 - 20 mg/dL   Creatinine, Ser 1.31 (H) 0.61 - 1.24 mg/dL   Calcium 9.3 8.9 - 10.3 mg/dL   GFR calc non Af Amer >60 >60 mL/min   GFR calc Af Amer >60 >60 mL/min    Comment: (NOTE) The eGFR has been calculated using the CKD EPI equation. This calculation has not been validated in all clinical situations. eGFR's persistently <60 mL/min signify possible Chronic Kidney Disease.    Anion gap 10 5 - 15    Comment: Performed  at [facility], [address] [place], [place] [address]  CBC with Differential     Status: Abnormal   Collection Time: 03/03/18  1:58 AM  Result Value Ref Range   WBC 14.9 (H) 4.0 - 10.5 K/uL   RBC 4.68 4.22 - 5.81 MIL/uL   Hemoglobin 15.9 13.0 - 17.0 g/dL   HCT 45.1 39.0 - 52.0 %   MCV 96.4 78.0 - 100.0 fL   MCH 34.0 26.0 - 34.0 pg   MCHC 35.3 30.0 - 36.0 g/dL   RDW 11.6 11.5 - 15.5 %   Platelets 313 150 - 400 K/uL   Neutrophils Relative % 80 %   Neutro Abs 11.8 (H) 1.7 - 7.7 K/uL   Lymphocytes Relative 14 %   Lymphs Abs 2.1 0.7 - 4.0 K/uL   Monocytes Relative 6 %   Monocytes Absolute 1.0 0.1 - 1.0 K/uL   Eosinophils Relative 0 %   Eosinophils Absolute 0.1 0.0 - 0.7 K/uL   Basophils Relative 0 %   Basophils Absolute 0.0 0.0 - 0.1 K/uL    Comment: Performed at [facility], [address] [place], [place] [address]  Urinalysis, Routine w reflex microscopic     Status: Abnormal   Collection Time: 03/03/18  1:58 AM  Result Value Ref Range   Color,  Urine YELLOW YELLOW   APPearance CLEAR CLEAR   Specific Gravity, Urine >1.030 (H) 1.005 - 1.030   pH 6.0 5.0 - 8.0   Glucose, UA NEGATIVE NEGATIVE mg/dL   Hgb urine dipstick SMALL (A) NEGATIVE   Bilirubin Urine NEGATIVE NEGATIVE   Ketones, ur NEGATIVE NEGATIVE mg/dL   Protein, ur NEGATIVE NEGATIVE mg/dL   Nitrite NEGATIVE NEGATIVE   Leukocytes, UA NEGATIVE NEGATIVE    Comment: Performed at [facility], [address] [place], [place] [address]  Urinalysis, Microscopic (reflex)     Status: None   Collection Time: 03/03/18  1:58 AM  Result Value Ref Range   RBC / HPF 6-10 0 - 5 RBC/hpf   WBC, UA 0-5 0 - 5 WBC/hpf   Bacteria, UA NONE SEEN NONE SEEN   Squamous Epithelial / LPF 0-5 0 - 5   Mucus PRESENT     Comment: Performed at [facility], [address] [place], [place] [address]   Ct Renal Stone Study  Result Date: 03/03/2018 CLINICAL DATA:  Intermittent left flank pain  EXAM: CT ABDOMEN AND PELVIS WITHOUT CONTRAST TECHNIQUE: Multidetector CT imaging of the abdomen and pelvis was performed following the standard protocol without IV contrast. COMPARISON:  CT 10/30/2009 FINDINGS: Lower chest: No acute abnormality. Hepatobiliary: No focal liver abnormality is seen. No gallstones, gallbladder wall thickening, or biliary dilatation. Pancreas: Unremarkable. No pancreatic ductal dilatation or surrounding inflammatory changes. Spleen: Normal in size without focal abnormality. Adrenals/Urinary Tract: Adrenal glands are normal. No right hydronephrosis. Moderate left hydronephrosis and hydroureter, secondary to a 3 mm stone in the distal left ureter just proximal to the left UVJ. Additional 11 mm stone in the dilated left renal pelvis. Stomach/Bowel: Stomach is within normal limits. Appendix appears normal. No evidence of bowel wall thickening, distention, or inflammatory changes. Vascular/Lymphatic: Minimal aortic atherosclerosis. No aneurysmal dilatation. No significant adenopathy Reproductive: Prostate is unremarkable. Other: Negative for free air or free fluid. Musculoskeletal: No acute or significant osseous findings. IMPRESSION: 1. Moderate left hydronephrosis and hydroureter, secondary to a 3 mm stone in the distal left ureter just proximal to the left UVJ. 2. 11 mm stone in the left renal pelvis. Electronically Signed   By: Donavan Foil M.D.   On: 03/03/2018 03:04    Pending Labs Unresulted Labs (From admission, onward)   None      Vitals/Pain Today's Vitals   03/03/18 0301 03/03/18 0330 03/03/18 0353 03/03/18 0356  BP: (!) 150/84     Pulse: 62 84    Resp: 14     Temp:      TempSrc:      SpO2: 97% 99%    Weight:      Height:      PainSc: Asleep  Asleep 8     Isolation Precautions No active isolations  Medications Medications  0.9 %  sodium chloride infusion (has no administration in time range)  ondansetron (ZOFRAN) injection 4 mg (has no administration in  time range)  HYDROmorphone (DILAUDID) injection 1 mg (has no administration in time range)  fentaNYL (SUBLIMAZE) injection 50 mcg (has no administration in time range)  ondansetron (ZOFRAN) injection 4 mg (4 mg Intravenous Given 03/03/18 0205)  fentaNYL (SUBLIMAZE) injection 50 mcg (50 mcg Intravenous Given 03/03/18 0205)  fentaNYL (SUBLIMAZE) injection 50 mcg (50 mcg Intravenous Given 03/03/18 0217)  HYDROmorphone (DILAUDID) injection 1 mg (1 mg Intravenous Given 03/03/18 0239)  HYDROmorphone (DILAUDID) injection 1 mg (1 mg Intravenous Given 03/03/18  0258)    Mobility walks

## 2018-03-03 NOTE — ED Notes (Signed)
Patient transported to CT 

## 2018-03-03 NOTE — Anesthesia Preprocedure Evaluation (Signed)
Anesthesia Evaluation  Patient identified by MRN, date of birth, ID band Patient awake    Reviewed: Allergy & Precautions, NPO status , Patient's Chart, lab work & pertinent test results  Airway Mallampati: II  TM Distance: >3 FB Neck ROM: Full    Dental  (+) Teeth Intact, Dental Advisory Given   Pulmonary neg pulmonary ROS,    Pulmonary exam normal breath sounds clear to auscultation       Cardiovascular Exercise Tolerance: Good negative cardio ROS Normal cardiovascular exam Rhythm:Regular Rate:Normal     Neuro/Psych negative neurological ROS     GI/Hepatic negative GI ROS, Neg liver ROS,   Endo/Other  Obesity   Renal/GU Renal disease (AKI)Left ureteral stone      Musculoskeletal negative musculoskeletal ROS (+)   Abdominal   Peds  Hematology negative hematology ROS (+)   Anesthesia Other Findings Day of surgery medications reviewed with the patient.  Reproductive/Obstetrics                             Anesthesia Physical Anesthesia Plan  ASA: II and emergent  Anesthesia Plan: General   Post-op Pain Management:    Induction: Intravenous  PONV Risk Score and Plan: 3 and Midazolam, Dexamethasone and Ondansetron  Airway Management Planned: Oral ETT  Additional Equipment:   Intra-op Plan:   Post-operative Plan: Extubation in OR  Informed Consent: I have reviewed the patients History and Physical, chart, labs and discussed the procedure including the risks, benefits and alternatives for the proposed anesthesia with the patient or authorized representative who has indicated his/her understanding and acceptance.   Dental advisory given  Plan Discussed with: CRNA  Anesthesia Plan Comments: (+Nausea/vomiting. Will intubate with ETT.)        Anesthesia Quick Evaluation

## 2018-03-03 NOTE — Transfer of Care (Signed)
Immediate Anesthesia Transfer of Care Note  Patient: Randy Scott  Procedure(s) Performed: CYSTOSCOPY/URETEROSCOPY/HOLMIUM LASER/STONE REMOVAL WITH BASKET/STENT PLACEMENT (Left Ureter)  Patient Location: PACU  Anesthesia Type:General  Level of Consciousness: alert , drowsy and patient cooperative  Airway & Oxygen Therapy: Patient Spontanous Breathing and Patient connected to face mask oxygen  Post-op Assessment: Report given to RN and Post -op Vital signs reviewed and stable  Post vital signs: Reviewed and stable  Last Vitals:  Vitals Value Taken Time  BP 138/78 03/03/2018  1:49 PM  Temp    Pulse 76 03/03/2018  1:58 PM  Resp 12 03/03/2018  1:58 PM  SpO2 100 % 03/03/2018  1:58 PM  Vitals shown include unvalidated device data.  Last Pain:  Vitals:   03/03/18 1349  TempSrc:   PainSc: (P) 0-No pain      Patients Stated Pain Goal: 3 (03/03/18 [id number])  Complications: No apparent anesthesia complications

## 2018-03-03 NOTE — Discharge Instructions (Signed)
1 - You may have urinary urgency (bladder spasms) and bloody urine on / off with stent in place. This is normal. ° °2 - Call MD or go to ER for fever >102, severe pain / nausea / vomiting not relieved by medications, or acute change in medical status ° °

## 2018-03-04 ENCOUNTER — Encounter (HOSPITAL_COMMUNITY): Payer: Self-pay | Admitting: Urology

## 2018-03-04 DIAGNOSIS — N202 Calculus of kidney with calculus of ureter: Secondary | ICD-10-CM | POA: Diagnosis not present

## 2018-03-05 NOTE — Anesthesia Postprocedure Evaluation (Signed)
Anesthesia Post Note  Patient: Randy Scott  Procedure(s) Performed: CYSTOSCOPY/URETEROSCOPY/HOLMIUM LASER/STONE REMOVAL WITH BASKET/STENT PLACEMENT (Left Ureter)     Patient location during evaluation: PACU Anesthesia Type: General Level of consciousness: awake and alert Pain management: pain level controlled Vital Signs Assessment: post-procedure vital signs reviewed and stable Respiratory status: spontaneous breathing, nonlabored ventilation and respiratory function stable Cardiovascular status: blood pressure returned to baseline and stable Postop Assessment: no apparent nausea or vomiting Anesthetic complications: no    Last Vitals:  Vitals:   03/03/18 1433 03/03/18 1809  BP: 140/87 (!) 142/79  Pulse: 80 74  Resp: 12 16  Temp: 37 C 36.7 C  SpO2: 96% 100%    Last Pain:  Vitals:   03/03/18 1809  TempSrc: Oral  PainSc:                  Cecile HearingStephen Edward Rewa Weissberg

## 2018-03-06 DIAGNOSIS — N201 Calculus of ureter: Secondary | ICD-10-CM | POA: Diagnosis not present

## 2018-04-01 DIAGNOSIS — N202 Calculus of kidney with calculus of ureter: Secondary | ICD-10-CM | POA: Diagnosis not present

## 2018-04-15 DIAGNOSIS — N200 Calculus of kidney: Secondary | ICD-10-CM | POA: Diagnosis not present

## 2018-04-16 DIAGNOSIS — N200 Calculus of kidney: Secondary | ICD-10-CM | POA: Diagnosis not present

## 2018-05-20 DIAGNOSIS — N202 Calculus of kidney with calculus of ureter: Secondary | ICD-10-CM | POA: Diagnosis not present

## 2018-05-20 MED FILL — POTASSIUM CITRATE ER 10 MEQ: 10 MEQ | 90 days supply | Qty: 180 | Fill #0

## 2018-05-22 MED FILL — diazePAM 10 MG TABS: 10 | 1 days supply | Qty: 1 | Fill #0

## 2018-05-27 DIAGNOSIS — G4700 Insomnia, unspecified: Secondary | ICD-10-CM | POA: Diagnosis not present

## 2018-05-27 DIAGNOSIS — F439 Reaction to severe stress, unspecified: Secondary | ICD-10-CM | POA: Diagnosis not present

## 2018-05-27 DIAGNOSIS — F419 Anxiety disorder, unspecified: Secondary | ICD-10-CM | POA: Diagnosis not present

## 2018-05-27 MED FILL — ALPRAZolam 0.5 MG TABS: 0.5 | 23 days supply | Qty: 45 | Fill #0

## 2018-05-27 MED FILL — BuPROPion HCL ER (XL) 300 M: 300 | 90 days supply | Qty: 90 | Fill #0

## 2018-06-19 DIAGNOSIS — M79672 Pain in left foot: Secondary | ICD-10-CM | POA: Diagnosis not present

## 2018-07-11 DIAGNOSIS — M7989 Other specified soft tissue disorders: Secondary | ICD-10-CM | POA: Diagnosis not present

## 2018-07-11 MED FILL — MELOXICAM 15 MG TABLET: 15 | 14 days supply | Qty: 14 | Fill #0

## 2018-08-12 DIAGNOSIS — F439 Reaction to severe stress, unspecified: Secondary | ICD-10-CM | POA: Diagnosis not present

## 2018-08-12 DIAGNOSIS — G4700 Insomnia, unspecified: Secondary | ICD-10-CM | POA: Diagnosis not present

## 2018-08-12 DIAGNOSIS — F419 Anxiety disorder, unspecified: Secondary | ICD-10-CM | POA: Diagnosis not present

## 2018-08-22 DIAGNOSIS — Z302 Encounter for sterilization: Secondary | ICD-10-CM | POA: Diagnosis not present

## 2018-08-22 MED FILL — POTASSIUM CITRATE ER 10 MEQ: 10 MEQ | 90 days supply | Qty: 180 | Fill #1

## 2018-08-22 MED FILL — buPROPion HCL ER (XL) 300 M: 300 | 90 days supply | Qty: 90 | Fill #1

## 2018-08-22 MED FILL — ALPRAZolam 0.5 MG TABS: 0.5 | 23 days supply | Qty: 45 | Fill #1

## 2018-11-26 MED FILL — buPROPion HCL ER (XL) 300 M: 300 | 90 days supply | Qty: 90 | Fill #0

## 2018-11-26 MED FILL — ALPRAZolam 0.5 MG TABS: 0.5 | 23 days supply | Qty: 45 | Fill #0

## 2019-01-23 MED FILL — ALPRAZolam 0.5 MG TABS: 0.5 | 23 days supply | Qty: 45 | Fill #1

## 2019-01-29 MED FILL — POTASSIUM CITRATE ER 10 MEQ: 10 MEQ | 90 days supply | Qty: 180 | Fill #2

## 2019-02-10 DIAGNOSIS — F439 Reaction to severe stress, unspecified: Secondary | ICD-10-CM | POA: Diagnosis not present

## 2019-02-10 DIAGNOSIS — F419 Anxiety disorder, unspecified: Secondary | ICD-10-CM | POA: Diagnosis not present

## 2019-02-10 DIAGNOSIS — G4700 Insomnia, unspecified: Secondary | ICD-10-CM | POA: Diagnosis not present

## 2019-03-05 MED FILL — buPROPion HCL ER (XL) 300 M: 300 | 90 days supply | Qty: 90 | Fill #1

## 2019-03-05 MED FILL — ALPRAZOLAM 0.5 MG TABS: 0.5 | 23 days supply | Qty: 45 | Fill #2

## 2019-03-10 DIAGNOSIS — Z125 Encounter for screening for malignant neoplasm of prostate: Secondary | ICD-10-CM | POA: Diagnosis not present

## 2019-03-10 DIAGNOSIS — Z13220 Encounter for screening for lipoid disorders: Secondary | ICD-10-CM | POA: Diagnosis not present

## 2019-03-10 DIAGNOSIS — R7989 Other specified abnormal findings of blood chemistry: Secondary | ICD-10-CM | POA: Diagnosis not present

## 2019-03-10 DIAGNOSIS — Z0000 Encounter for general adult medical examination without abnormal findings: Secondary | ICD-10-CM | POA: Diagnosis not present

## 2019-04-25 MED FILL — ALPRAZOLAM 0.5 MG TABS: 0.5 | 23 days supply | Qty: 45 | Fill #3

## 2019-06-02 MED FILL — buPROPion HCL ER (XL) 300 M: 300 | 90 days supply | Qty: 90 | Fill #0

## 2019-06-03 MED FILL — ALPRAZolam 0.5 MG TABS: 0.5 | 30 days supply | Qty: 45 | Fill #0

## 2019-06-05 MED FILL — POTASSIUM CITRATE ER 10 MEQ: 10 MEQ | 30 days supply | Qty: 60 | Fill #0

## 2019-07-03 MED FILL — ALPRAZolam 0.5 MG TABS: 0.5 | 30 days supply | Qty: 45 | Fill #1

## 2019-07-03 MED FILL — POTASSIUM CITRATE ER 10 MEQ: 10 MEQ | 30 days supply | Qty: 60 | Fill #1

## 2019-07-14 DIAGNOSIS — Z87442 Personal history of urinary calculi: Secondary | ICD-10-CM | POA: Diagnosis not present

## 2019-07-14 DIAGNOSIS — R82991 Hypocitraturia: Secondary | ICD-10-CM | POA: Diagnosis not present

## 2019-08-05 MED FILL — POTASSIUM CITRATE ER 10 MEQ: 10 MEQ | 30 days supply | Qty: 60 | Fill #2

## 2019-08-05 MED FILL — ALPRAZolam 0.5 MG TABS: 0.5 | 30 days supply | Qty: 45 | Fill #2

## 2019-08-06 DIAGNOSIS — M79671 Pain in right foot: Secondary | ICD-10-CM | POA: Diagnosis not present

## 2019-08-06 DIAGNOSIS — M7661 Achilles tendinitis, right leg: Secondary | ICD-10-CM | POA: Diagnosis not present

## 2019-08-06 MED FILL — predniSONE 5 MG TABS: 5 | 12 days supply | Qty: 48 | Fill #0

## 2019-08-18 DIAGNOSIS — F419 Anxiety disorder, unspecified: Secondary | ICD-10-CM | POA: Diagnosis not present

## 2019-08-18 DIAGNOSIS — F439 Reaction to severe stress, unspecified: Secondary | ICD-10-CM | POA: Diagnosis not present

## 2019-08-18 MED FILL — BUPROPION HCL ER (XL) 300 M: 300 | 90 days supply | Qty: 90 | Fill #0

## 2019-09-01 MED FILL — ALPRAZolam 0.5 MG TABS: 0.5 | 23 days supply | Qty: 45 | Fill #0

## 2019-09-01 MED FILL — POTASSIUM CITRATE ER 10 MEQ: 10 MEQ | 30 days supply | Qty: 60 | Fill #3

## 2019-10-21 MED FILL — ALPRAZolam 0.5 MG TABS: 0.5 | 23 days supply | Qty: 45 | Fill #1

## 2019-10-21 MED FILL — POTASSIUM CITRATE ER 10 MEQ: 10 MEQ | 30 days supply | Qty: 60 | Fill #0

## 2019-11-01 IMAGING — CT CT RENAL STONE PROTOCOL
2 of 4 series · 16 of 46 positions shown, 18 images · non-contrast
Comparison: CT 10/30/2009

CLINICAL DATA: Intermittent left flank pain

EXAM:
CT ABDOMEN AND PELVIS WITHOUT CONTRAST
TECHNIQUE: Multidetector CT imaging of the abdomen and pelvis was performed
following the standard protocol without IV contrast.

[Series 2: axial st · axial · 0.83mm/px · z∈[+804,+1289]mm · 13 of 107 slices shown, 15 images]
[im 5/107  soft-tissue]
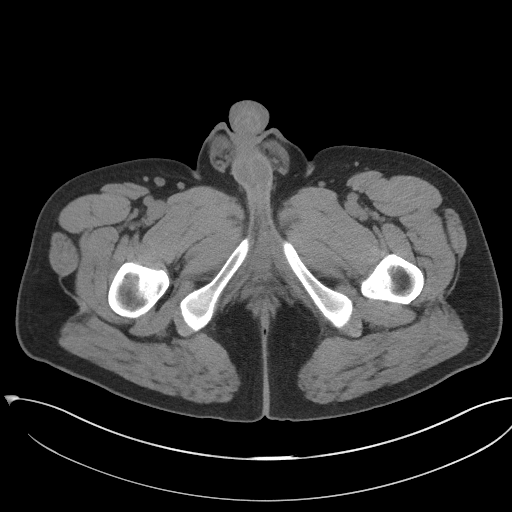
[im 5/107  bone]
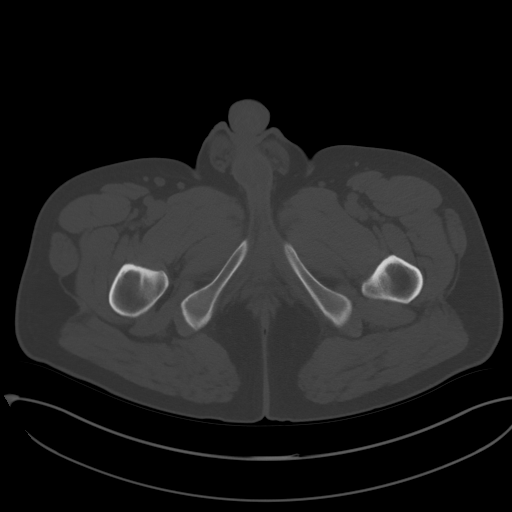
[im 14/107  soft-tissue]
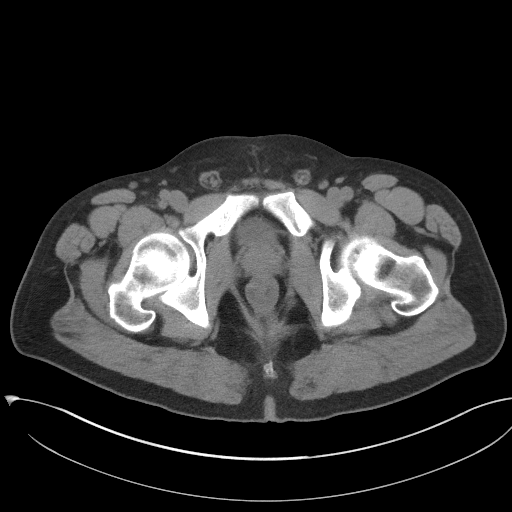
[im 23/107  soft-tissue]
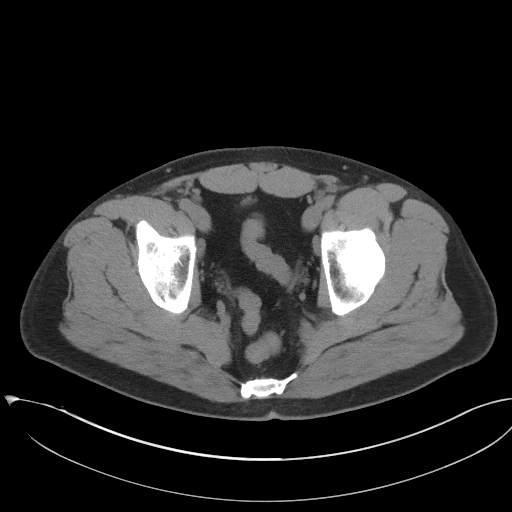
[im 31/107  soft-tissue]
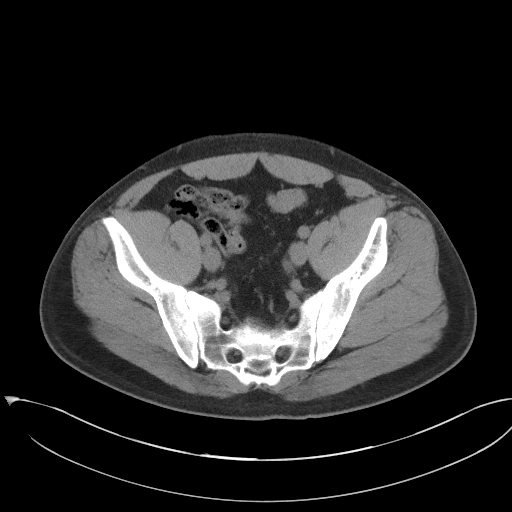
[im 36/107  soft-tissue]
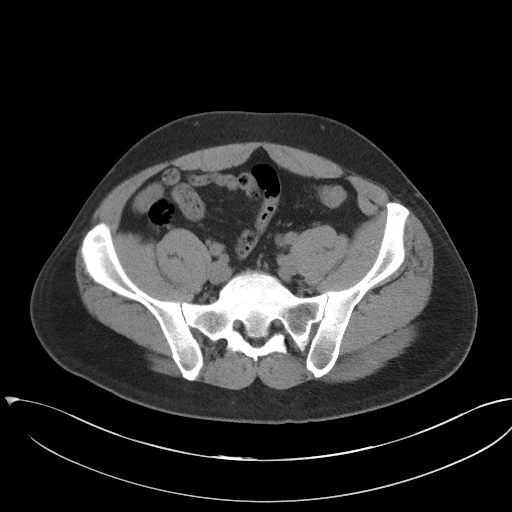
[im 45/107  soft-tissue]
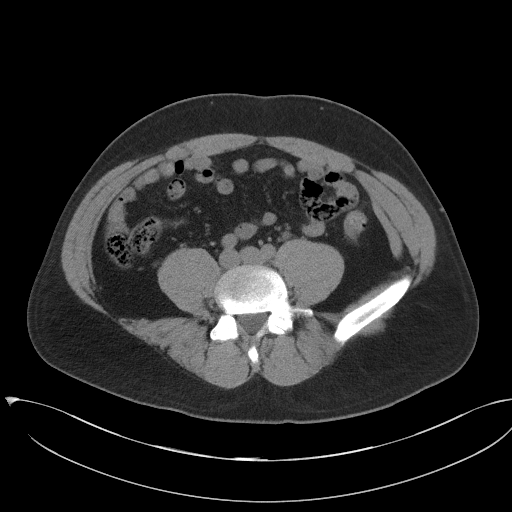
[im 54/107  soft-tissue]
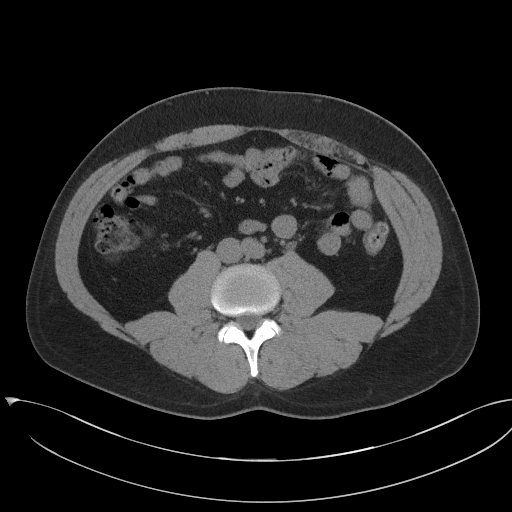
[im 62/107  soft-tissue]
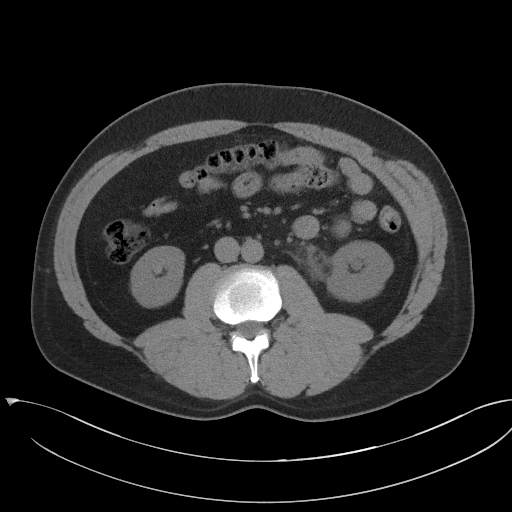
[im 71/107  soft-tissue]
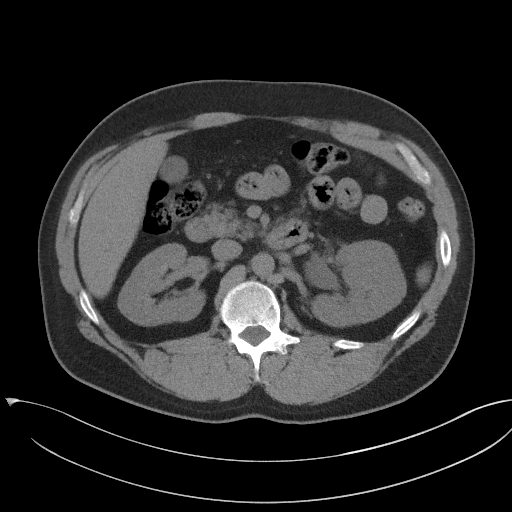
[im 71/107  bone]
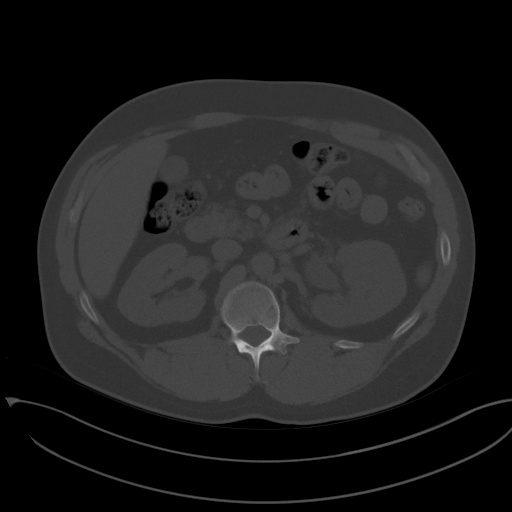
[im 76/107  soft-tissue]
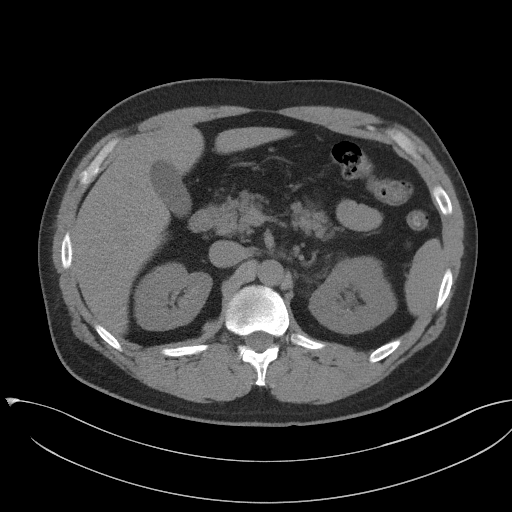
[im 84/107  soft-tissue]
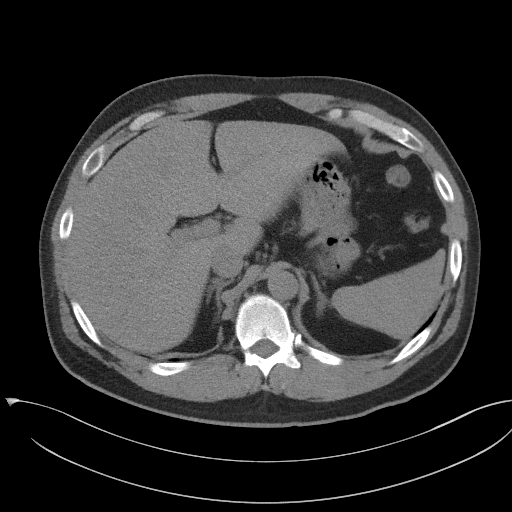
[im 93/107  soft-tissue]
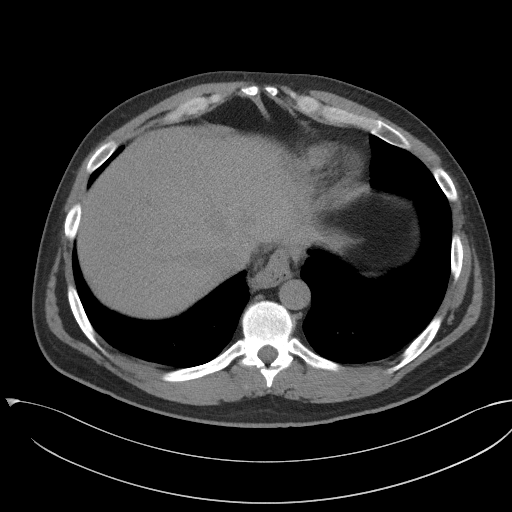
[im 102/107  soft-tissue]
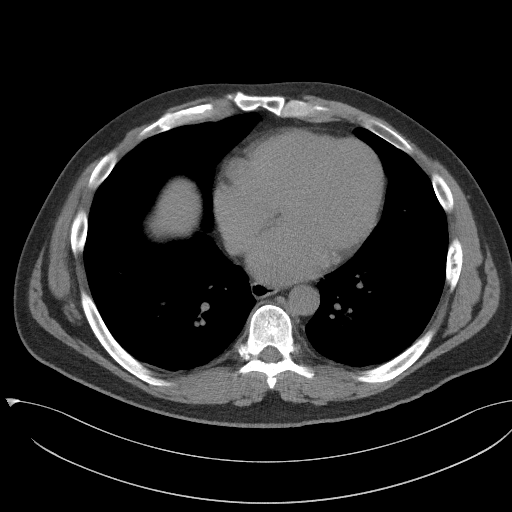

[Series 5: coronal st · coronal · 0.87mm/px · 3 of 102 slices shown]
[im 34/102  soft-tissue]
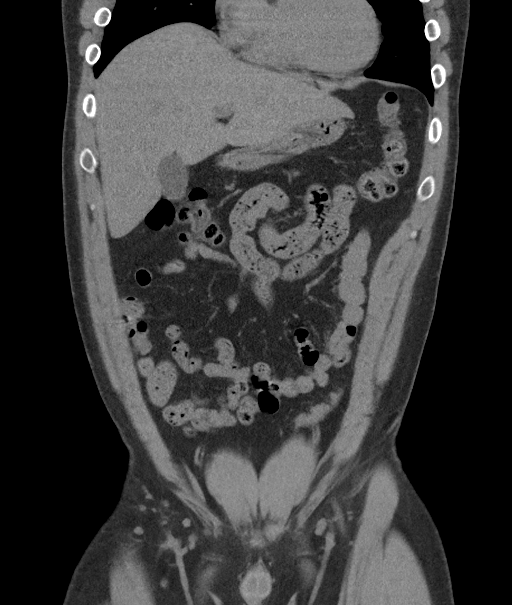
[im 45/102  soft-tissue]
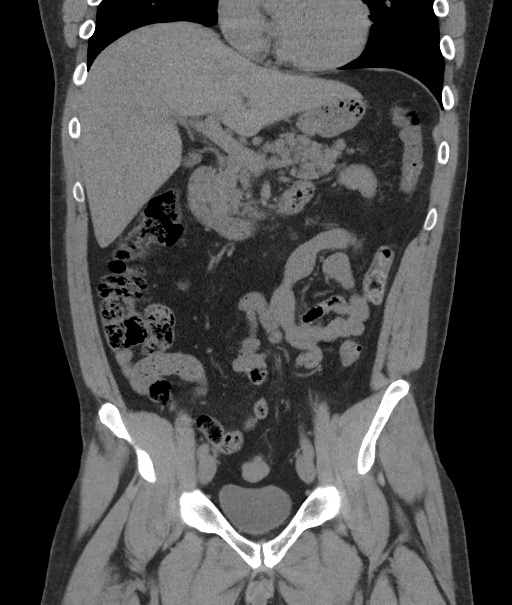
[im 57/102  soft-tissue]
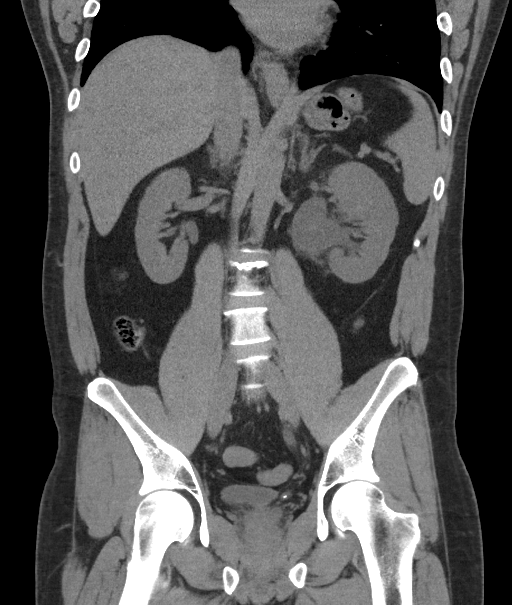

[16 of 46 positions shown; findings below may reference images not displayed]

FINDINGS: Lower chest: No acute abnormality.

Hepatobiliary: No focal liver abnormality is seen. No gallstones,
gallbladder wall thickening, or biliary dilatation.

Pancreas: Unremarkable. No pancreatic ductal dilatation or
surrounding inflammatory changes.

Spleen: Normal in size without focal abnormality.

Adrenals/Urinary Tract: Adrenal glands are normal. No right
hydronephrosis. Moderate left hydronephrosis and hydroureter,
secondary to a 3 mm stone in the distal left ureter just proximal to
the left UVJ. Additional 11 mm stone in the dilated left renal
pelvis.

Stomach/Bowel: Stomach is within normal limits. Appendix appears
normal. No evidence of bowel wall thickening, distention, or
inflammatory changes.

Vascular/Lymphatic: Minimal aortic atherosclerosis. No aneurysmal
dilatation. No significant adenopathy

Reproductive: Prostate is unremarkable.

Other: Negative for free air or free fluid.

Musculoskeletal: No acute or significant osseous findings.
IMPRESSION: 1. Moderate left hydronephrosis and hydroureter, secondary to a 3 mm
stone in the distal left ureter just proximal to the left UVJ.
2. 11 mm stone in the left renal pelvis.

## 2019-11-21 ENCOUNTER — Ambulatory Visit: Payer: 59

## 2019-11-22 ENCOUNTER — Ambulatory Visit: Payer: 59

## 2019-12-01 MED FILL — BUPROPION HCL XL 300 MG TAB: 300 | 90 days supply | Qty: 90 | Fill #1

## 2019-12-01 MED FILL — ALPRAZolam 0.5 MG TABS: 0.5 | 23 days supply | Qty: 45 | Fill #2

## 2020-03-25 ENCOUNTER — Other Ambulatory Visit (HOSPITAL_BASED_OUTPATIENT_CLINIC_OR_DEPARTMENT_OTHER): Payer: Self-pay | Admitting: Family Medicine

## 2020-03-25 DIAGNOSIS — R002 Palpitations: Secondary | ICD-10-CM | POA: Diagnosis not present

## 2020-03-25 DIAGNOSIS — F419 Anxiety disorder, unspecified: Secondary | ICD-10-CM | POA: Diagnosis not present

## 2020-03-25 DIAGNOSIS — R079 Chest pain, unspecified: Secondary | ICD-10-CM | POA: Diagnosis not present

## 2020-03-25 MED FILL — ALPRAZolam 0.5 MG TABS: 0.5 | 15 days supply | Qty: 30 | Fill #0

## 2020-03-25 MED FILL — buPROPion HCL ER (XL) 300 M: 300 | 90 days supply | Qty: 90 | Fill #0

## 2020-03-26 ENCOUNTER — Other Ambulatory Visit (HOSPITAL_COMMUNITY): Payer: Self-pay | Admitting: Family Medicine

## 2020-03-26 ENCOUNTER — Telehealth (HOSPITAL_COMMUNITY): Payer: Self-pay | Admitting: *Deleted

## 2020-03-26 DIAGNOSIS — R079 Chest pain, unspecified: Secondary | ICD-10-CM

## 2020-03-26 NOTE — Telephone Encounter (Signed)
Close encounter 

## 2020-03-30 ENCOUNTER — Ambulatory Visit (HOSPITAL_COMMUNITY)
Admission: RE | Admit: 2020-03-30 | Discharge: 2020-03-30 | Disposition: A | Discharge status: Home / Self Care | Payer: 59 | Source: Ambulatory Visit | Attending: Cardiovascular Disease | Admitting: Cardiovascular Disease

## 2020-03-30 ENCOUNTER — Other Ambulatory Visit: Payer: Self-pay

## 2020-03-30 DIAGNOSIS — R079 Chest pain, unspecified: Secondary | ICD-10-CM | POA: Insufficient documentation

## 2020-03-30 HISTORY — PX: OTHER SURGICAL HISTORY: SHX169

## 2020-03-30 LAB — EXERCISE TOLERANCE TEST
Estimated workload: 13.7 METS
Exercise duration (min): 12 min
Exercise duration (sec): 11 s
MPHR: 179 {beats}/min
Peak HR: 169 {beats}/min
Percent HR: 94 %
Rest HR: 83 {beats}/min

## 2020-05-12 HISTORY — PX: OTHER SURGICAL HISTORY: SHX169

## 2020-05-18 ENCOUNTER — Ambulatory Visit (INDEPENDENT_AMBULATORY_CARE_PROVIDER_SITE_OTHER): Payer: 59 | Admitting: Cardiology

## 2020-05-18 ENCOUNTER — Other Ambulatory Visit: Payer: Self-pay

## 2020-05-18 ENCOUNTER — Encounter: Payer: Self-pay | Admitting: Cardiology

## 2020-05-18 DIAGNOSIS — R002 Palpitations: Secondary | ICD-10-CM | POA: Diagnosis not present

## 2020-05-18 DIAGNOSIS — R079 Chest pain, unspecified: Secondary | ICD-10-CM | POA: Diagnosis not present

## 2020-05-18 NOTE — Progress Notes (Signed)
Primary Care Provider: Shirlyn Goltz (Inactive) Cardiologist: No primary care provider on file. Electrophysiologist: None  Clinic Note: Chief Complaint  Patient presents with  . New Patient (Initial Visit)  . Chest Pain  . Palpitations    HPI:    Randy Scott is a 44 y.o. male  with a PMH notable for anxiety and arthritis pains who is being seen today for the evaluation of CHEST PAIN WITH EXERTION & PALPITATIONS at the request of Sheliah Hatch, PA-C.  Randy Scott was last seen on March 26, 2019 by Randy Scott complaining of chest discomfort with exercise.  He noted that his anxiety was relatively well controlled on current dose of Wellbutrin rarely requiring Xanax.  Only 2 panic attacks in the last year.  He usually notes exertional chest pain after completing his 1 when he is doing his cooldown.  He describes it as a shooting pain traveling from his left chest down his left arm.  The episodes only last about 20 to 30 seconds and do not happen every time.  He first noticed this episode about 2 months ago after run.  He does drink V8 energy drinks but no coffee or sodas. --> He was evaluated with a treadmill stress test prior to this visit.  Recent Hospitalizations: None  Reviewed  CV studies:    The following studies were reviewed today: (if available, images/films reviewed: From Epic Chart or Care Everywhere)  GXT/ETT (03/30/2020): Exercised 12:11 min-13.7 METS.  Max heart rate 169 bpm (94% and MPHR).  No EKG changes noted.  Excellent exercise capacity.  LOW RISK  Interval History:   Randy Scott is being seen here today stating that he has been having these chest pain episodes most of the time, but not all the time when he does exercise.  Usually occurs following exercise the episodes are again described as a sharp pain radiating down the left arm.  They resolve spontaneously.  They have not been happening as much recently but what he is been noticing more now is a  sense of feeling his heart rate going up very fast and not coming down as quickly as it used to after stopping exercise.  What he really notes is that the palpitations seem to be what causes discomfort.  These episodes last about 30 to 40 seconds and is almost as if his heart rate just does not slow down like it should.  All these chest pain episodes occur with the tachycardia.  CV Review of Symptoms (Summary) Cardiovascular ROS: positive for - chest pain, palpitations and rapid heart rate negative for - dyspnea on exertion, edema, irregular heartbeat, orthopnea, paroxysmal nocturnal dyspnea, shortness of breath or Lightheadedness or dizziness, syncope/near syncope, TIA/amaurosis fugax, claudication  The patient does not have symptoms concerning for COVID-19 infection (fever, chills, cough, or new shortness of breath).   REVIEWED OF SYSTEMS   Review of Systems  Constitutional: Negative for malaise/fatigue and weight loss.  HENT: Negative for congestion and nosebleeds.   Respiratory: Negative for cough and shortness of breath.   Gastrointestinal: Negative for blood in stool and melena.  Genitourinary: Negative for hematuria.  Musculoskeletal: Negative for joint pain.  Neurological: Negative for dizziness, speech change and focal weakness.  Psychiatric/Behavioral: Negative for memory loss. The patient is nervous/anxious (No more than usual). The patient does not have insomnia.    I have reviewed and (if needed) personally updated the patient's problem list, medications, allergies, past medical and surgical history,  social and family history.   PAST MEDICAL HISTORY   Past Medical History:  Diagnosis Date  . Generalized anxiety disorder with panic attacks   . Ureterolithiasis     PAST SURGICAL HISTORY   Past Surgical History:  Procedure Laterality Date  . CYSTOSCOPY/URETEROSCOPY/HOLMIUM LASER/STENT PLACEMENT Left 03/03/2018   Procedure: CYSTOSCOPY/URETEROSCOPY/HOLMIUM LASER/STONE  REMOVAL WITH BASKET/STENT PLACEMENT;  Surgeon: Sebastian AcheManny, Theodore, MD;  Location: [facility];  Service: Urology;  Laterality: Left;     There is no immunization history on file for this patient.  MEDICATIONS/ALLERGIES   Current Meds  Medication Sig  . ALPRAZolam (XANAX) 0.5 MG tablet Take 0.5 mg by mouth 2 (two) times daily as needed.  Marland Kitchen. buPROPion (WELLBUTRIN XL) 300 MG 24 hr tablet Take 300 mg by mouth every morning.  . Cyanocobalamin (VITAMIN B-12) 2500 MCG SUBL   . potassium citrate (UROCIT-K) 10 MEQ (1080 MG) SR tablet potassium citrate ER 10 mEq (1,080 mg) tablet,extended release    No Known Allergies  SOCIAL HISTORY/FAMILY HISTORY   Reviewed in Epic:  Social History   Tobacco Use  . Smoking status: Never Smoker  . Smokeless tobacco: Never Used  Substance Use Topics  . Alcohol use: Yes  . Drug use: Never   Social History   Social History Narrative   Minimal caffeine use   Family History  Problem Relation Age of Onset  . Kidney failure Mother   . Heart disease Father        Not sure of details; was a long-term smoker  . Brain cancer Sister      OBJCTIVE -PE, EKG, labs   Wt Readings from Last 3 Encounters:  05/18/20 247 lb (112 kg)  03/03/18 236 lb 4.8 oz (107.2 kg)    Physical Exam: BP 132/90   Pulse 75   Ht 6\' 2"  (1.88 m)   Wt 247 lb (112 kg)   SpO2 96%   BMI 31.71 kg/m  Physical Exam Vitals reviewed.  Constitutional:      General: He is not in acute distress.    Appearance: Normal appearance. He is obese. He is not ill-appearing.     Comments: Healthy-appearing.  Well-groomed.  HENT:     Head: Atraumatic.  Neck:     Vascular: No carotid bruit, hepatojugular reflux or JVD.  Cardiovascular:     Rate and Rhythm: Normal rate and regular rhythm.  No extrasystoles are present.    Chest Wall: PMI is not displaced.     Pulses: Normal pulses and intact distal pulses.     Heart sounds: Normal heart sounds. No murmur heard.  No friction rub. No gallop.     Pulmonary:     Effort: Pulmonary effort is normal. No respiratory distress.     Breath sounds: Normal breath sounds.  Abdominal:     General: Abdomen is flat. Bowel sounds are normal. There is no distension.     Palpations: Abdomen is soft. There is no mass (No HSM).  Musculoskeletal:        General: No swelling. Normal range of motion.     Cervical back: Normal range of motion.  Neurological:     General: No focal deficit present.     Mental Status: He is alert and oriented to person, place, and time.  Psychiatric:        Mood and Affect: Mood normal.        Behavior: Behavior normal.        Thought Content: Thought content normal.  Judgment: Judgment normal.      Adult ECG Report  Rate: 75 ;  Rhythm: normal sinus rhythm and Borderline RVH; cannot exclude inferior MI, age undetermined.;  (Borderline incomplete RBBB-RSR') Narrative Interpretation: Borderline EKG. ->  Was told the previous EKG had a right bundle-branch block.  Could be a rate related bundle wrench block  Recent Labs: March 25, 2020  Na+ 140, K+ 4.2, Cl- 104, HCO3-26, BUN 18, Cr 109, Glu 90, Ca2+ 10.2; AST 20, ALT 30, AlkP 65  CBC: W 8.1, H/H 15.9/46.4, Plt 326  No results found for: CHOL, HDL, LDLCALC, LDLDIRECT, TRIG, CHOLHDL Lab Results  Component Value Date   CREATININE 1.31 (H) 03/03/2018   BUN 19 03/03/2018   NA 144 03/03/2018   K 4.5 03/03/2018   CL 106 03/03/2018   CO2 28 03/03/2018   No results found for: TSH  ASSESSMENT/PLAN    Problem List Items Addressed This Visit    Rapid palpitations    It sounds like his chest discomfort is associated with these rapid heart rate spells.  It is possible that he is having bursts of SVT.  Will evaluate with 30-day monitor.  Interestingly, did not see tachycardia following GXT      Relevant Orders   EKG 12-Lead (Completed)   CARDIAC EVENT MONITOR   Chest pain with normal EKG    Chest pain with incomplete right bundle branch block and possible  rate related right bundle branch block.  These episodes seem to be more related to tachycardia spells and not necessarily chest pain.  The chest pain is not with exertion, is without. Was evaluated with a GXT showing no significant EKG changes and no chest discomfort with exertion.  Plan for now will be to evaluate for the cause of palpitations with a 30-day event monitor.      Relevant Orders   EKG 12-Lead (Completed)   CARDIAC EVENT MONITOR      COVID-19 Education: The signs and symptoms of COVID-19 were discussed with the patient and how to seek care for testing (follow up with PCP or arrange E-visit).   The importance of social distancing and COVID-19 vaccination was discussed today.  The patient is practicing social distancing & Masking.   I spent a total of 30 minutes with the patient spent in direct patient consultation.  Additional time spent with chart review  / charting (studies, outside notes, etc): 12 Total Time: 42 min   Current medicines are reviewed at length with the patient today.  (+/- concerns) N/A  Notice: This dictation was prepared with Dragon dictation along with smaller phrase technology. Any transcriptional errors that result from this process are unintentional and may not be corrected upon review.  Patient Instructions / Medication Changes & Studies & Tests Ordered   Patient Instructions  Medication Instructions:  NO CHANGES *If you need a refill on your cardiac medications before your next appointment, please call your pharmacy*   Lab Work: NOT NEEDED   Testing/Procedures: Your physician has recommended that you wear an event monitor 30 DAY . Event monitors are medical devices that record the heart's electrical activity. Doctors most often [place] these monitors to diagnose arrhythmias. Arrhythmias are problems with the speed or rhythm of the heartbeat. The monitor is a small, portable device. You can wear one while you do your normal daily activities.  This is usually used to diagnose what is causing palpitations/syncope (passing out).    Follow-Up: At [facility], you and your health needs are  our priority.  As part of our continuing mission to provide you with exceptional heart care, we have created designated Provider Care Teams.  These Care Teams include your primary Cardiologist (physician) and Advanced Practice Providers (APPs -  Physician Assistants and Nurse Practitioners) who all work together to provide you with the care you need, when you need it.    Your next appointment:   2 month(s)  The format for your next appointment:   VIRTUAL OR IN PERSON   Provider:   Bryan Lemma, MD   Other Instructions  [employer] Cardiac Event Monitor Instructions Your physician has requested you wear your cardiac event monitor for __30___ days, (1-30). [employer] may call or text to confirm a shipping address. The monitor will be sent to a land address via UPS. [employer] will not ship a monitor to a PO BOX. It typically takes 3-5 days to receive your monitor after it has been enrolled. [employer] will assist with USPS tracking if your package is delayed. The telephone number for [employer] is [phone]. Once you have received your monitor, please review the enclosed instructions. Instruction tutorials can also be viewed under help and settings on the enclosed cell phone. Your monitor has already been registered assigning a specific monitor serial # to you.  Applying the monitor Remove cell phone from case and turn it on. The cell phone works as IT consultant and needs to be within [employer] of you at all times. The cell phone will need to be charged on a daily basis. We recommend you plug the cell phone into the enclosed charger at your bedside table every night.  Monitor batteries: You will receive two monitor batteries labelled #1 and #2. These are your recorders. Plug battery #2 onto the second connection on the enclosed  charger. Keep one battery on the charger at all times. This will keep the monitor battery deactivated. It will also keep it fully charged for when you need to switch your monitor batteries. A small light will be blinking on the battery emblem when it is charging. The light on the battery emblem will remain on when the battery is fully charged.  Open package of a Monitor strip. Insert battery #1 into black hood on strip and gently squeeze monitor battery onto connection as indicated in instruction booklet. Set aside while preparing skin.  Choose location for your strip, vertical or horizontal, as indicated in the instruction booklet. Shave to remove all hair from location. There cannot be any lotions, oils, powders, or colognes on skin where monitor is to be applied. Wipe skin clean with enclosed Saline wipe. Dry skin completely.  Peel paper labeled #1 off the back of the Monitor strip exposing the adhesive. Place the monitor on the chest in the vertical or horizontal position shown in the instruction booklet. One arrow on the monitor strip must be pointing upward. Carefully remove paper labeled #2, attaching remainder of strip to your skin. Try not to create any folds or wrinkles in the strip as you apply it.  Firmly press and release the circle in the center of the monitor battery. You will hear a small beep. This is turning the monitor battery on. The heart emblem on the monitor battery will light up every 5 seconds if the monitor battery in turned on and connected to the patient securely. Do not push and hold the circle down as this turns the monitor battery off. The cell phone will locate the monitor battery. A screen will appear on  the cell phone checking the connection of your monitor strip. This may read poor connection initially but change to good connection within the next minute. Once your monitor accepts the connection you will hear a series of 3 beeps followed by a climbing  crescendo of beeps. A screen will appear on the cell phone showing the two monitor strip placement options. Touch the picture that demonstrates where you applied the monitor strip.  Your monitor strip and battery are waterproof. You are able to shower, bathe, or swim with the monitor on. They just ask you do not submerge deeper than 3 feet underwater. We recommend removing the monitor if you are swimming in a lake, river, or ocean.  Your monitor battery will need to be switched to a fully charged monitor battery approximately once a week. The cell phone will alert you of an action which needs to be made.  On the cell phone, tap for details to reveal connection status, monitor battery status, and cell phone battery status. The green dots indicates your monitor is in good status. A red dot indicates there is something that needs your attention.  To record a symptom, click the circle on the monitor battery. In 30-60 seconds a list of symptoms will appear on the cell phone. Select your symptom and tap save. Your monitor will record a sustained or significant arrhythmia regardless of you clicking the button. Some patients do not feel the heart rhythm irregularities. [employer] will notify us of any serious or critical events.  Refer to instruction booklet for instructions on switching batteries, changing strips, the Do not disturb or Pause features, or any additional questions.  Call [employer] at [phone], to confirm your monitor is transmitting and record your baseline. They will answer any questions you may have regarding the monitor instructions at that time.  Returning the monitor to [employer] Place all equipment back into blue box. Peel off strip of paper to expose adhesive and close box securely. There is a prepaid UPS shipping label on this box. Drop in a UPS drop box, or at a UPS facility like [employer]. You may also contact [employer] to arrange UPS to pick up monitor package at  your home.     Studies Ordered:   Orders Placed This Encounter  Procedures  . CARDIAC EVENT MONITOR  . EKG 12-Lead     Bryan Lemma, M.D., M.S. Interventional Cardiologist   Pager # [phone] Phone # [phone] [address] [place], [place] [address]   Thank you for choosing Heartcare at [facility]!!

## 2020-05-18 NOTE — Patient Instructions (Addendum)
Medication Instructions:  NO CHANGES *If you need a refill on your cardiac medications before your next appointment, please call your pharmacy*   Lab Work: NOT NEEDED   Testing/Procedures: Your physician has recommended that you wear an event monitor 30 DAY . Event monitors are medical devices that record the heart's electrical activity. Doctors most often [place] these monitors to diagnose arrhythmias. Arrhythmias are problems with the speed or rhythm of the heartbeat. The monitor is a small, portable device. You can wear one while you do your normal daily activities. This is usually used to diagnose what is causing palpitations/syncope (passing out).    Follow-Up: At [facility], you and your health needs are our priority.  As part of our continuing mission to provide you with exceptional heart care, we have created designated Provider Care Teams.  These Care Teams include your primary Cardiologist (physician) and Advanced Practice Providers (APPs -  Physician Assistants and Nurse Practitioners) who all work together to provide you with the care you need, when you need it.    Your next appointment:   2 month(s)  The format for your next appointment:   VIRTUAL OR IN PERSON   Provider:   Bryan Lemma, MD   Other Instructions  [employer] Cardiac Event Monitor Instructions Your physician has requested you wear your cardiac event monitor for __30___ days, (1-30). [employer] may call or text to confirm a shipping address. The monitor will be sent to a land address via UPS. [employer] will not ship a monitor to a PO BOX. It typically takes 3-5 days to receive your monitor after it has been enrolled. [employer] will assist with USPS tracking if your package is delayed. The telephone number for [employer] is [phone]. Once you have received your monitor, please review the enclosed instructions. Instruction tutorials can also be viewed under help and settings on the enclosed cell  phone. Your monitor has already been registered assigning a specific monitor serial # to you.  Applying the monitor Remove cell phone from case and turn it on. The cell phone works as IT consultant and needs to be within [employer] of you at all times. The cell phone will need to be charged on a daily basis. We recommend you plug the cell phone into the enclosed charger at your bedside table every night.  Monitor batteries: You will receive two monitor batteries labelled #1 and #2. These are your recorders. Plug battery #2 onto the second connection on the enclosed charger. Keep one battery on the charger at all times. This will keep the monitor battery deactivated. It will also keep it fully charged for when you need to switch your monitor batteries. A small light will be blinking on the battery emblem when it is charging. The light on the battery emblem will remain on when the battery is fully charged.  Open package of a Monitor strip. Insert battery #1 into black hood on strip and gently squeeze monitor battery onto connection as indicated in instruction booklet. Set aside while preparing skin.  Choose location for your strip, vertical or horizontal, as indicated in the instruction booklet. Shave to remove all hair from location. There cannot be any lotions, oils, powders, or colognes on skin where monitor is to be applied. Wipe skin clean with enclosed Saline wipe. Dry skin completely.  Peel paper labeled #1 off the back of the Monitor strip exposing the adhesive. Place the monitor on the chest in the vertical or horizontal position shown in the instruction booklet.  One arrow on the monitor strip must be pointing upward. Carefully remove paper labeled #2, attaching remainder of strip to your skin. Try not to create any folds or wrinkles in the strip as you apply it.  Firmly press and release the circle in the center of the monitor battery. You will hear a small beep. This is turning  the monitor battery on. The heart emblem on the monitor battery will light up every 5 seconds if the monitor battery in turned on and connected to the patient securely. Do not push and hold the circle down as this turns the monitor battery off. The cell phone will locate the monitor battery. A screen will appear on the cell phone checking the connection of your monitor strip. This may read poor connection initially but change to good connection within the next minute. Once your monitor accepts the connection you will hear a series of 3 beeps followed by a climbing crescendo of beeps. A screen will appear on the cell phone showing the two monitor strip placement options. Touch the picture that demonstrates where you applied the monitor strip.  Your monitor strip and battery are waterproof. You are able to shower, bathe, or swim with the monitor on. They just ask you do not submerge deeper than 3 feet underwater. We recommend removing the monitor if you are swimming in a lake, river, or ocean.  Your monitor battery will need to be switched to a fully charged monitor battery approximately once a week. The cell phone will alert you of an action which needs to be made.  On the cell phone, tap for details to reveal connection status, monitor battery status, and cell phone battery status. The green dots indicates your monitor is in good status. A red dot indicates there is something that needs your attention.  To record a symptom, click the circle on the monitor battery. In 30-60 seconds a list of symptoms will appear on the cell phone. Select your symptom and tap save. Your monitor will record a sustained or significant arrhythmia regardless of you clicking the button. Some patients do not feel the heart rhythm irregularities. [employer] will notify us of any serious or critical events.  Refer to instruction booklet for instructions on switching batteries, changing strips, the Do not disturb or  Pause features, or any additional questions.  Call [employer] at [phone], to confirm your monitor is transmitting and record your baseline. They will answer any questions you may have regarding the monitor instructions at that time.  Returning the monitor to [employer] Place all equipment back into blue box. Peel off strip of paper to expose adhesive and close box securely. There is a prepaid UPS shipping label on this box. Drop in a UPS drop box, or at a UPS facility like [employer]. You may also contact [employer] to arrange UPS to pick up monitor package at your home.

## 2020-05-19 ENCOUNTER — Telehealth: Payer: Self-pay | Admitting: Radiology

## 2020-05-19 NOTE — Telephone Encounter (Signed)
Enrolled patient for a 30 day [employer] Event Monitor to be mailed to patients home  

## 2020-05-25 ENCOUNTER — Encounter: Payer: Self-pay | Admitting: Cardiology

## 2020-05-25 NOTE — Assessment & Plan Note (Addendum)
Chest pain with incomplete right bundle branch block and possible rate related right bundle branch block.  These episodes seem to be more related to tachycardia spells and not necessarily chest pain.  The chest pain is not with exertion, is without. Was evaluated with a GXT showing no significant EKG changes and no chest discomfort with exertion.  Plan for now will be to evaluate for the cause of palpitations with a 30-day event monitor.

## 2020-05-25 NOTE — Assessment & Plan Note (Addendum)
It sounds like his chest discomfort is associated with these rapid heart rate spells.  It is possible that he is having bursts of SVT.  Will evaluate with 30-day monitor.  Interestingly, did not see tachycardia following GXT

## 2020-05-31 ENCOUNTER — Encounter (INDEPENDENT_AMBULATORY_CARE_PROVIDER_SITE_OTHER): Payer: 59

## 2020-05-31 DIAGNOSIS — R002 Palpitations: Secondary | ICD-10-CM | POA: Diagnosis not present

## 2020-05-31 DIAGNOSIS — R079 Chest pain, unspecified: Secondary | ICD-10-CM

## 2020-07-02 MED FILL — ALPRAZolam 0.5 MG TABS: 0.5 | 30 days supply | Qty: 30 | Fill #0

## 2020-07-02 MED FILL — buPROPion HCL ER (XL) 300 M: 300 | 90 days supply | Qty: 90 | Fill #1

## 2020-08-10 DIAGNOSIS — N202 Calculus of kidney with calculus of ureter: Secondary | ICD-10-CM | POA: Diagnosis not present

## 2020-08-10 DIAGNOSIS — R82998 Other abnormal findings in urine: Secondary | ICD-10-CM | POA: Diagnosis not present

## 2020-08-13 ENCOUNTER — Telehealth: Payer: Self-pay | Admitting: *Deleted

## 2020-08-13 ENCOUNTER — Telehealth (INDEPENDENT_AMBULATORY_CARE_PROVIDER_SITE_OTHER): Payer: 59 | Admitting: Cardiology

## 2020-08-13 ENCOUNTER — Encounter: Payer: Self-pay | Admitting: Cardiology

## 2020-08-13 VITALS — BP 148/93 | HR 78 | Ht 74.0 in | Wt 244.0 lb

## 2020-08-13 DIAGNOSIS — R030 Elevated blood-pressure reading, without diagnosis of hypertension: Secondary | ICD-10-CM | POA: Diagnosis not present

## 2020-08-13 DIAGNOSIS — R002 Palpitations: Secondary | ICD-10-CM

## 2020-08-13 DIAGNOSIS — R079 Chest pain, unspecified: Secondary | ICD-10-CM

## 2020-08-13 NOTE — Telephone Encounter (Signed)
  Patient Consent for Virtual Visit         [name] has provided verbal consent on 08/13/2020 for a virtual visit (video or telephone).   CONSENT FOR VIRTUAL VISIT FOR:  Randy Scott  By participating in this virtual visit I agree to the following:  I hereby voluntarily request, consent and authorize [facility] and its employed or contracted physicians, physician assistants, nurse practitioners or other licensed health care professionals (the Practitioner), to provide me with telemedicine health care services (the "Services") as deemed necessary by the treating Practitioner. I acknowledge and consent to receive the Services by the Practitioner via telemedicine. I understand that the telemedicine visit will involve communicating with the Practitioner through live audiovisual communication technology and the disclosure of certain medical information by electronic transmission. I acknowledge that I have been given the opportunity to request an in-person assessment or other available alternative prior to the telemedicine visit and am voluntarily participating in the telemedicine visit.  I understand that I have the right to withhold or withdraw my consent to the use of telemedicine in the course of my care at any time, without affecting my right to future care or treatment, and that the Practitioner or I may terminate the telemedicine visit at any time. I understand that I have the right to inspect all information obtained and/or recorded in the course of the telemedicine visit and may receive copies of available information for a reasonable fee.  I understand that some of the potential risks of receiving the Services via telemedicine include:  Marland Kitchen Delay or interruption in medical evaluation due to technological equipment failure or disruption; . Information transmitted may not be sufficient (e.g. poor resolution of images) to allow for appropriate medical decision making by the Practitioner;  and/or  . In rare instances, security protocols could fail, causing a breach of personal health information.  Furthermore, I acknowledge that it is my responsibility to provide information about my medical history, conditions and care that is complete and accurate to the best of my ability. I acknowledge that Practitioner's advice, recommendations, and/or decision may be based on factors not within their control, such as incomplete or inaccurate data provided by me or distortions of diagnostic images or specimens that may result from electronic transmissions. I understand that the practice of medicine is not an exact science and that Practitioner makes no warranties or guarantees regarding treatment outcomes. I acknowledge that a copy of this consent can be made available to me via my patient portal [facility] MyChart), or I can request a printed copy by calling the office of [facility].    I understand that my insurance will be billed for this visit.   I have read or had this consent read to me. . I understand the contents of this consent, which adequately explains the benefits and risks of the Services being provided via telemedicine.  . I have been provided ample opportunity to ask questions regarding this consent and the Services and have had my questions answered to my satisfaction. . I give my informed consent for the services to be provided through the use of telemedicine in my medical care

## 2020-08-13 NOTE — Patient Instructions (Addendum)
Medication Instructions:  None for now  *If you need a refill on your cardiac medications before your next appointment, please call your pharmacy*   Lab Work:  Try to schedule your follow-up appointment in the morning, and if you come in for early morning appointment, come in fasting so we can check some blood work at that time.   Testing/Procedures: None   Follow-Up: At [facility], you and your health needs are our priority.  As part of our continuing mission to provide you with exceptional heart care, we have created designated Provider Care Teams.  These Care Teams include your primary Cardiologist (physician) and Advanced Practice Providers (APPs -  Physician Assistants and Nurse Practitioners) who all work together to provide you with the care you need, when you need it.    Your next appointment:   6 month(s)  The format for your next appointment:   In Person  Provider:   Bryan Lemma, MD   Other Instructions For blood pressure monitoring, keep an eye on your blood pressures and if you can record intermittent blood pressure recordings even if is at different visits at health care professional offices.  We talked about the best way to initially start treating blood pressure is with lifestyle modifications including dietary changes, and exercises that need to weight loss.   Hypertension information Salty six information   Managing Your Hypertension Hypertension is commonly called high blood pressure. This is when the force of your blood pressing against the walls of your arteries is too strong. Arteries are blood vessels that carry blood from your heart throughout your body. Hypertension forces the heart to work harder to pump blood, and may cause the arteries to become narrow or stiff. Having untreated or uncontrolled hypertension can cause heart attack, stroke, kidney disease, and other problems. What are blood pressure readings? A blood pressure reading consists of  a higher number over a lower number. Ideally, your blood pressure should be below 120/80. The first ("top") number is called the systolic pressure. It is a measure of the pressure in your arteries as your heart beats. The second ("bottom") number is called the diastolic pressure. It is a measure of the pressure in your arteries as the heart relaxes. What does my blood pressure reading mean? Blood pressure is classified into four stages. Based on your blood pressure reading, your health care provider may use the following stages to determine what type of treatment you need, if any. Systolic pressure and diastolic pressure are measured in a unit called mm Hg. Normal  Systolic pressure: below 120.  Diastolic pressure: below 80. Elevated  Systolic pressure: 120-129.  Diastolic pressure: below 80. Hypertension stage 1  Systolic pressure: 130-139.  Diastolic pressure: 80-89. Hypertension stage 2  Systolic pressure: 140 or above.  Diastolic pressure: 90 or above. What health risks are associated with hypertension? Managing your hypertension is an important responsibility. Uncontrolled hypertension can lead to:  A heart attack.  A stroke.  A weakened blood vessel (aneurysm).  Heart failure.  Kidney damage.  Eye damage.  Metabolic syndrome.  Memory and concentration problems. What changes can I make to manage my hypertension? Hypertension can be managed by making lifestyle changes and possibly by taking medicines. Your health care provider will help you make a plan to bring your blood pressure within a normal range. Eating and drinking   Eat a diet that is high in fiber and potassium, and low in salt (sodium), added sugar, and fat. An example eating  plan is called the DASH (Dietary Approaches to Stop Hypertension) diet. To eat this way: ? Eat plenty of fresh fruits and vegetables. Try to fill half of your plate at each meal with fruits and vegetables. ? Eat whole grains, such  as whole wheat pasta, brown rice, or whole grain bread. Fill about one quarter of your plate with whole grains. ? Eat low-fat diary products. ? Avoid fatty cuts of meat, processed or cured meats, and poultry with skin. Fill about one quarter of your plate with lean proteins such as fish, chicken without skin, beans, eggs, and tofu. ? Avoid premade and processed foods. These tend to be higher in sodium, added sugar, and fat.  Reduce your daily sodium intake. Most people with hypertension should eat less than 1,500 mg of sodium a day.  Limit alcohol intake to no more than 1 drink a day for nonpregnant women and 2 drinks a day for men. One drink equals 12 oz of beer, 5 oz of wine, or 1 oz of hard liquor. Lifestyle  Work with your health care provider to maintain a healthy body weight, or to lose weight. Ask what an ideal weight is for you.  Get at least 30 minutes of exercise that causes your heart to beat faster (aerobic exercise) most days of the week. Activities may include walking, swimming, or biking.  Include exercise to strengthen your muscles (resistance exercise), such as weight lifting, as part of your weekly exercise routine. Try to do these types of exercises for 30 minutes at least 3 days a week.  Do not use any products that contain nicotine or tobacco, such as cigarettes and e-cigarettes. If you need help quitting, ask your health care provider.  Control any long-term (chronic) conditions you have, such as high cholesterol or diabetes. Monitoring  Monitor your blood pressure at home as told by your health care provider. Your personal target blood pressure may vary depending on your medical conditions, your age, and other factors.  Have your blood pressure checked regularly, as often as told by your health care provider. Working with your health care provider  Review all the medicines you take with your health care provider because there may be side effects or  interactions.  Talk with your health care provider about your diet, exercise habits, and other lifestyle factors that may be contributing to hypertension.  Visit your health care provider regularly. Your health care provider can help you create and adjust your plan for managing hypertension. Will I need medicine to control my blood pressure? Your health care provider may prescribe medicine if lifestyle changes are not enough to get your blood pressure under control, and if:  Your systolic blood pressure is 130 or higher.  Your diastolic blood pressure is 80 or higher. Take medicines only as told by your health care provider. Follow the directions carefully. Blood pressure medicines must be taken as prescribed. The medicine does not work as well when you skip doses. Skipping doses also puts you at risk for problems. Contact a health care provider if:  You think you are having a reaction to medicines you have taken.  You have repeated (recurrent) headaches.  You feel dizzy.  You have swelling in your ankles.  You have trouble with your vision. Get help right away if:  You develop a severe headache or confusion.  You have unusual weakness or numbness, or you feel faint.  You have severe pain in your chest or abdomen.  You vomit repeatedly.  You have trouble breathing. Summary  Hypertension is when the force of blood pumping through your arteries is too strong. If this condition is not controlled, it may put you at risk for serious complications.  Your personal target blood pressure may vary depending on your medical conditions, your age, and other factors. For most people, a normal blood pressure is less than 120/80.  Hypertension is managed by lifestyle changes, medicines, or both. Lifestyle changes include weight loss, eating a healthy, low-sodium diet, exercising more, and limiting alcohol. This information is not intended to replace advice given to you by your health care  provider. Make sure you discuss any questions you have with your health care provider. Document Revised: 12/20/2018 Document Reviewed: 07/26/2016 Elsevier Patient Education  2020 [employer].

## 2020-08-13 NOTE — Assessment & Plan Note (Signed)
High blood pressure recording today.  He says he has been in other practices and has had some high blood pressure recordings as well.  I am not yet ready given the diagnosis of hypertension although probably does meet criteria for early stage I hypertension.  Recommendations now will be 6 months reassessment with lifestyle modifications.  We discussed dietary changes and increased exercise.  Will provide information pamphlets on hypertension, salty 6 dietary avoidance recommendations, and weight loss.

## 2020-08-13 NOTE — Progress Notes (Signed)
Virtual Visit via Telephone Note   This visit type was conducted due to national recommendations for restrictions regarding the COVID-19 Pandemic (e.g. social distancing) in an effort to limit this patient's exposure and mitigate transmission in our community.  Due to his co-morbid illnesses, this patient is at least at moderate risk for complications without adequate follow up.  This format is felt to be most appropriate for this patient at this time.  The patient did not have access to video technology/had technical difficulties with video requiring transitioning to audio format only (telephone).  All issues noted in this document were discussed and addressed.  No physical exam could be performed with this format.  Please refer to the patient's chart for his  consent to telehealth for [facility].   Patient has given verbal permission to conduct this visit via virtual appointment and to bill insurance 08/15/2020 8:07 PM     Evaluation Performed:  Follow-up visit  Date:  08/15/2020   ID:  Randy Scott, DOB 08/04/1976, MRN [id number]  Patient Location: Home Provider Location: Home Office  PCP:  Randy Scott (Inactive)  Cardiologist:  No primary care provider on file. Randy Lemma, MD Electrophysiologist:  None   Chief Complaint:  No chief complaint on file.   History of Present Illness:    Randy Scott is a 44 y.o. male with PMH notable for Anxiety and Arthritis who presents via audio/video conferencing for a telehealth visit today [name], seen at the request of Randy Scott, [place]..   ASSESSMENT & PLAN:    Problem List Items Addressed This Visit    Rapid palpitations - Primary    Monitor did not show bursts of SVT, he had short 3-4 beats of PVCs that were not symptomatic.  I doubt that is what he is feeling.  Potentially could have short bursts of PSVT, but these are very short spells. May be if we  have to use blood pressure medications, could consider beta-blocker as first choice as opposed to standard therapy with ACE inhibitor diuretic.      Chest pain with normal EKG    No ischemic changes on EKG during treadmill stress test with no chest pain during test.  He is having short little spells seen less than a minute if not closer to 20-30 seconds that are relatively rare and infrequent.  Not necessarily associate with rest or exertion.  Most likely consistent with musculoskeletal discomfort.  He says that they are in the upper left part of his chest which would go along with pectoralis minor muscle spasms versus  intercostal muscle spasm.  Likely noncardiac in nature.  Nothing on monitor to suspect arrhythmia related symptoms..      Elevated blood pressure reading    High blood pressure recording today.  He says he has been in other practices and has had some high blood pressure recordings as well.  I am not yet ready given the diagnosis of hypertension although probably does meet criteria for early stage I hypertension.  Recommendations now will be 6 months reassessment with lifestyle modifications.  We discussed dietary changes and increased exercise.  Will provide information pamphlets on hypertension, salty 6 dietary avoidance recommendations, and weight loss.        ------------------------------------------- Randy Scott was seen 9/7 for evaluation of chest pain episodes that happen with or without exercise.  These are described as a sharp pain radiating down left arm  and resolve spontaneously.  The actual not having that much at the time that I am seeing him.  He also noted feel his heart rate going fast and not pronounced quickly as it should after stopping exercise.  Episodes and associated with some discomfort in his chest last maybe 30 to 40 seconds. -> He had already been evaluated with a GXT on March 30, 2020 that was nonischemic.  30-day event monitor ordered-see below   Hospitalizations:  . None   Recent - Interim CV studies:    The following studies were reviewed today:  Event monitor:  worn from September 20-June 29, 2020 15 stable events noted: 2 automatically detected episodes with 4 beats of VT followed by another 1 of 3-6 beats VT\ ; 12 manually detected events either sinus rhythm or sinus arrhythmia versus sinus tachycardia. No ectopy or arrhythmia noted.  Predominant rhythm sinus: Minimum heart rate 43 bpm, maximum heart rate 156 bpm. Average heart rate 81 bpm.  No evidence of A. fib or any prolonged arrhythmias.  1 run of 4 PVCs-by definition VT-rate 138 bpm; second run of 6 PVCs (NSVT with a rate of Roughly 150 bpm     Inerval History   Randy Scott seems doing okay for the most part.  He says he has some chest pain off and on a once twice a day.  Again visibly short little spells not associated with any particular activity.  Usually occur when she is sitting on his recliner or lying down in bed.  Sharp stabbing left lateral chest discomfort worse with movements. Heart rate spells seem to be better, not that frequent.  He has not really been doing as much exercise bike however.  Cardiovascular ROS: positive for - chest pain and palpitations negative for - dyspnea on exertion, orthopnea, paroxysmal nocturnal dyspnea, shortness of breath or Syncope/near syncope, TIA/amaurosis fugax, claudication  ROS:  Please see the history of present illness.    The patient does not have symptoms concerning for COVID-19 infection (fever, chills, cough, or new shortness of breath).  Review of Systems  Constitutional: Negative for malaise/fatigue and weight loss.  HENT: Negative for nosebleeds.   Respiratory: Negative for cough and shortness of breath.   Gastrointestinal: Negative for blood in stool and melena.  Genitourinary: Negative for hematuria.  Musculoskeletal: Negative for joint pain.  Neurological: Negative for dizziness.   Psychiatric/Behavioral: The patient is nervous/anxious (Fairly well controlled).     The patient is practicing social distancing.  Past Medical History:  Diagnosis Date  . Generalized anxiety disorder with panic attacks   . Ureterolithiasis    Past Surgical History:  Procedure Laterality Date  . CYSTOSCOPY/URETEROSCOPY/HOLMIUM LASER/STENT PLACEMENT Left 03/03/2018   Procedure: CYSTOSCOPY/URETEROSCOPY/HOLMIUM LASER/STONE REMOVAL WITH BASKET/STENT PLACEMENT;  Surgeon: Sebastian Ache, MD;  Location: [facility];  Service: Urology;  Laterality: Left;     Current Meds  Medication Sig  . ALPRAZolam (XANAX) 0.5 MG tablet Take 0.5 mg by mouth 2 (two) times daily as needed.  Marland Kitchen buPROPion (WELLBUTRIN XL) 300 MG 24 hr tablet Take 300 mg by mouth every morning.     Allergies:   Patient has no known allergies.   Social History   Tobacco Use  . Smoking status: Never Smoker  . Smokeless tobacco: Never Used  Substance Use Topics  . Alcohol use: Yes  . Drug use: Never     Family Hx: The patient's family history includes Brain cancer in his sister; Heart disease in his father; Kidney failure in  his mother.   Labs/Other Tests and Data Reviewed:    EKG:  No ECG reviewed.  Recent Labs: No results found for requested labs within last 8760 hours.   Recent Lipid Panel No results found for: CHOL, TRIG, HDL, CHOLHDL, LDLCALC, LDLDIRECT  Wt Readings from Last 3 Encounters:  08/13/20 244 lb (110.7 kg)  05/18/20 247 lb (112 kg)  03/03/18 236 lb 4.8 oz (107.2 kg)     Objective:    Vital Signs:  BP (!) 148/93   Pulse 78   Ht 6\' 2"  (1.88 m)   Wt 244 lb (110.7 kg)   BMI 31.33 kg/m   VITAL SIGNS:  reviewed Pleasant male in no acute distress. A&O x 3.  normal Mood & Affect Non-labored respirations  ---------------------------------  COVID-19 Education: The signs and symptoms of COVID-19 were discussed with the patient and how to seek care for testing (follow up with PCP or arrange  E-visit).   The importance of social distancing was discussed today.  Time:   Today, I have spent 13 minutes with the patient with telehealth technology discussing the above problems.  Additional 5-minute spent charting.   Medication Adjustments/Labs and Tests Ordered: Current medicines are reviewed at length with the patient today.  Concerns regarding medicines are outlined above.   Patient Instructions  Medication Instructions:  None for now  *If you need a refill on your cardiac medications before your next appointment, please call your pharmacy*   Lab Work:  Try to schedule your follow-up appointment in the morning, and if you come in for early morning appointment, come in fasting so we can check some blood work at that time.   Testing/Procedures: None   Follow-Up: At [facility], you and your health needs are our priority.  As part of our continuing mission to provide you with exceptional heart care, we have created designated Provider Care Teams.  These Care Teams include your primary Cardiologist (physician) and Advanced Practice Providers (APPs -  Physician Assistants and Nurse Practitioners) who all work together to provide you with the care you need, when you need it.    Your next appointment:   6 month(s)  The format for your next appointment:   In Person  Provider:   [facility], MD   Other Instructions For blood pressure monitoring, keep an eye on your blood pressures and if you can record intermittent blood pressure recordings even if is at different visits at health care professional offices.  We talked about the best way to initially start treating blood pressure is with lifestyle modifications including dietary changes, and exercises that need to weight loss.   Hypertension information Salty six information    Signed, Randy Lemma, MD  08/15/2020 8:07 PM    [facility]

## 2020-08-13 NOTE — Assessment & Plan Note (Signed)
No ischemic changes on EKG during treadmill stress test with no chest pain during test.  He is having short little spells seen less than a minute if not closer to 20-30 seconds that are relatively rare and infrequent.  Not necessarily associate with rest or exertion.  Most likely consistent with musculoskeletal discomfort.  He says that they are in the upper left part of his chest which would go along with pectoralis minor muscle spasms versus  intercostal muscle spasm.  Likely noncardiac in nature.  Nothing on monitor to suspect arrhythmia related symptoms.Marland Kitchen

## 2020-08-13 NOTE — Telephone Encounter (Signed)
RN spoke to patient. Instruction were given  from today's virtual visit 08/13/20 .  AVS SUMMARY has been sent by mychart . Follow up appointment schedule for 02/22/20 at 8 am.   Patient verbalized understanding

## 2020-08-13 NOTE — Assessment & Plan Note (Signed)
Monitor did not show bursts of SVT, he had short 3-4 beats of PVCs that were not symptomatic.  I doubt that is what he is feeling.  Potentially could have short bursts of PSVT, but these are very short spells. May be if we have to use blood pressure medications, could consider beta-blocker as first choice as opposed to standard therapy with ACE inhibitor diuretic.

## 2020-08-15 ENCOUNTER — Encounter: Payer: Self-pay | Admitting: Cardiology

## 2020-11-05 ENCOUNTER — Other Ambulatory Visit (HOSPITAL_BASED_OUTPATIENT_CLINIC_OR_DEPARTMENT_OTHER): Payer: Self-pay | Admitting: Family Medicine

## 2020-11-05 MED FILL — buPROPion HCL ER (XL) 300 M: 300 | 90 days supply | Qty: 90 | Fill #2

## 2020-11-05 MED FILL — ALPRAZOLAM 0.5 MG TABS: 0.5 | 30 days supply | Qty: 30 | Fill #0

## 2021-02-18 ENCOUNTER — Encounter: Payer: Self-pay | Admitting: Cardiology

## 2021-02-18 NOTE — Progress Notes (Signed)
Primary Care Provider: Roseanna Rainbow, PA-C (Inactive) Cardiologist: None Electrophysiologist: None  Clinic Note: Chief Complaint  Patient presents with   Palpitations    Has been having episodes of fast heart rate and high blood pressure spells with flushing and lightheadedness.  Also some chest pain.     ===================================  ASSESSMENT/PLAN   Problem List Items Addressed This Visit     Rapid palpitations    Previous event monitor only showed short little bursts of SVT.  Nothing long enough to explain the symptoms he is currently having.  He could indeed be having episodic PSVT now which could explain symptoms.  Interesting though with his hypertension associated with the tachycardia a and symptoms of feeling flushed, and lightheaded would like to exclude Feo.  Plan: Check serum and urine catecholamine levels. Once labs are checked, we will start him on Toprol 25 mg daily.        Relevant Orders   EKG 12-Lead (Completed)   Catecholamines, fractionated, urine, 24 hour   Catecholamines, fractionated, plasma (Completed)   Chest pain with normal EKG    Previously evaluated with a GXT that was nonischemic.  He is not actively having exertional chest pain.  The symptoms he is having is not at rest and often associated with his tachycardia.  I suspect he may just be feeling discomfort from the tachycardia.       Elevated blood pressure reading - Primary    He does have episodic hypertension.  The spells do not seem to be back consistent with pheochromocytoma, however he is having spells of significant tachycardia with hypertension and feeling flushed and dizzy.  We will check urine and serum catecholamines and then start him on low-dose beta-blocker.  Would not want to be overly aggressive because his resting heart rate is only 66 bpm.       Relevant Orders   EKG 12-Lead (Completed)   Catecholamines, fractionated, urine, 24 hour   Catecholamines,  fractionated, plasma (Completed)    ===================================  HPI:    Randy Scott is a 45 y.o. male with a PMH notable for Anxiety & Stage I HTN who presents today for 43-month follow-up evaluation for Chest Pain with Exertion and Palpitations initially seen at the request of Tera Helper, [place].  Randy Scott was last seen via telemedicine on 08/13/2020 in follow-up from his initial consultation visit.  We discussed results of his monitor which showed 3-4 beats of PVCs but no short bursts of PAT or PSVT.  He was also evaluated with a GXT that showed no evidence of ischemia.  His blood pressure was little high, with likely diagnosis of stage I hypertension. -> discussed Salty-6 diet & lifestyle modifications / wgt loss -- plan 6 month f/u to reassess BP & Palpitations.  Recent Hospitalizations: none  Reviewed  CV studies:    The following studies were reviewed today: (if available, images/films reviewed: From Epic Chart or Care Everywhere) NONE:   Interval History:   Randy Scott presents here today for evaluation of an episode of chest discomfort and rapid heart rate.  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  He felt lightheaded and dizzy.  He did feel a little bit discomfort in his chest but that he felt it was more  related to the tachycardia.  Since then he has been having episodic chest pain with or without exertion where he feels his heart rate going up.  This associated with feeling flushed and dizzy, foggy headed.  With these episodes he tends to notice that his blood pressure is higher than 140s over 90s range.  When his pressure is lower and heart rate is slower he feels better.  He says his been having spells maybe about 2-3 times a week and may last about 10 to 15 minutes.  He has felt dizzy and  lightheaded but not had any syncope or near syncope.  The chest discomfort is not necessarily associated with exertion.  He is not having any resting of exertional dyspnea.  When he is not having the tachycardia spells he is feeling fine.  Just a little bit fatigued for a while after the spells.  He has chronic mild end of day swelling for which he wears support socks.  He says he is on his feet all day long at work.  CV Review of Symptoms (Summary) Cardiovascular ROS: positive for - chest pain, palpitations, rapid heart rate, and these are associated with short bursts of feeling heart rate going up and blood pressure going up.  Will sometimes feel flushed and lightheaded.  Other times is somewhat pale and dizzy.  These usually happen at the end of the day.  Can happen 2-3 times a week lasting 10 to 15 minutes.  Not always associated chest pain but chest pain otherwise. negative for - dyspnea on exertion, loss of consciousness, orthopnea, paroxysmal nocturnal dyspnea, shortness of breath, or TIA/amaurosis fugax, claudication  REVIEWED OF SYSTEMS   Review of Systems  Constitutional:  Negative for malaise/fatigue and weight loss.  Gastrointestinal:  Negative for blood in stool.  Genitourinary:  Negative for hematuria.  Musculoskeletal:  Negative for joint pain.  Neurological:  Positive for dizziness (Sometimes gets dizzy with his heart rate spells).  Psychiatric/Behavioral:  The patient is nervous/anxious (stable).     I have reviewed and (if needed) personally updated the patient's problem list, medications, allergies, past medical and surgical history, social and family history.   PAST MEDICAL HISTORY   Past Medical History:  Diagnosis Date   Generalized anxiety disorder with panic attacks    Ureterolithiasis     PAST SURGICAL HISTORY   Past Surgical History:  Procedure Laterality Date   1830 DAY CARDIAC EVENT MONITOR  05/2020   Predominant rhythm: Sinus with heart rate range of 43  to 156 bpm.  Average 81 bpm.  2 short bursts of PVCs/Non-sustained VT - 4 beats (138 bpm) & 6 beats (150 bpm).  No sustained tachyarrhythmias - Afib, Atrial Flutter, SVT, PAT, or VT.   CYSTOSCOPY/URETEROSCOPY/HOLMIUM LASER/STENT PLACEMENT Left 03/03/2018   Procedure: CYSTOSCOPY/URETEROSCOPY/HOLMIUM LASER/STONE REMOVAL WITH BASKET/STENT PLACEMENT;  Surgeon: Sebastian AcheManny, Theodore, MD;  Location: [facility];  Service: Urology;  Laterality: Left;   GRADUATED EXERCISE TREADMILL STRESS TEST (ETT/GXT)  03/30/2020   LOW RISK/NEGATIVE GXT. Exercised 12:11 min. 13.7 METS. HR from 95 to 169 bpm - 94% MPHR.  No ST-T wave changes. No CP. Excellent Exercise Capacity.    There is no immunization history on file for this patient.  MEDICATIONS/ALLERGIES   Current Meds  Medication Sig   ALPRAZolam (XANAX) 0.5 MG tablet Take 0.5 mg by mouth 2 (two) times daily as needed.   buPROPion (WELLBUTRIN XL) 300 MG 24 hr tablet Take 300 mg by mouth every morning.   Cyanocobalamin (VITAMIN B-12) 2500  MCG SUBL Take by mouth daily.    [DISCONTINUED] ALPRAZolam (XANAX) 0.5 MG tablet TAKE 1 TABLET BY MOUTH 2 TIMES DAILY AS NEEDED FOR 30 DAYS **NEED OFFICE VISIT FOR FURTHER REFILLS**   [DISCONTINUED] buPROPion (WELLBUTRIN XL) 300 MG 24 hr tablet TAKE 1 TABLET BY MOUTH EVERY MORNING    No Known Allergies  SOCIAL HISTORY/FAMILY HISTORY   Reviewed in Epic:  Pertinent findings:  Social History   Tobacco Use   Smoking status: Never   Smokeless tobacco: Never  Substance Use Topics   Alcohol use: Yes   Drug use: Never   Social History   Social History Narrative   Minimal caffeine use    OBJCTIVE -PE, EKG, labs   Wt Readings from Last 3 Encounters:  02/21/21 244 lb (110.7 kg)  08/13/20 244 lb (110.7 kg)  05/18/20 247 lb (112 kg)    Physical Exam: BP 123/81   Pulse 66   Ht 6\' 2"  (1.88 m)   Wt 244 lb (110.7 kg)   SpO2 100%   BMI 31.33 kg/m  Physical Exam Vitals reviewed.  Constitutional:      General: He is not  in acute distress.    Appearance: Normal appearance. He is obese. He is not ill-appearing or toxic-appearing.     Comments: Well-groomed.  Healthy-appearing  HENT:     Head: Normocephalic and atraumatic.  Cardiovascular:     Rate and Rhythm: Normal rate and regular rhythm. No extrasystoles are present.    Chest Wall: PMI is not displaced.     Pulses: Normal pulses.     Heart sounds: S1 normal and S2 normal. Heart sounds are distant. No murmur heard.   No friction rub. No gallop.  Pulmonary:     Effort: Pulmonary effort is normal. No respiratory distress.     Breath sounds: Normal breath sounds. No wheezing, rhonchi or rales.  Chest:     Chest wall: No tenderness.  Musculoskeletal:        General: No swelling. Normal range of motion.     Cervical back: Normal range of motion and neck supple.  Skin:    General: Skin is warm and dry.  Neurological:     General: No focal deficit present.     Mental Status: He is alert and oriented to person, place, and time.     Gait: Gait normal.  Psychiatric:        Mood and Affect: Mood normal.        Behavior: Behavior normal.        Thought Content: Thought content normal.        Judgment: Judgment normal.     Adult ECG Report  Rate: 66;  Rhythm: normal sinus rhythm and borderline criteria for RVH.  Otherwise normal axis, intervals and durations. ; No acute changes.  Narrative Interpretation: Stable  Home monitor results during an episode shows sinus tachycardia rate 100 bpm.  Follow-up shows sinus rhythm heart rate 86 beats a minute.  Recent Labs:  June 2020: TC 160, TG 140, HDL 31, LDL 101. March 25, 2020: Hgb 15.9, Cr 1.09, K+ 4.2, ALT/AST 30/20. No results found for: CHOL, HDL, LDLCALC, LDLDIRECT, TRIG, CHOLHDL Lab Results  Component Value Date   CREATININE 1.31 (H) 03/03/2018   BUN 19 03/03/2018   NA 144 03/03/2018   K 4.5 03/03/2018   CL 106 03/03/2018   CO2 28 03/03/2018   CBC Latest Ref Rng & Units 03/03/2018 10/30/2009  WBC  4.0 - 10.5 K/uL  14.9(H) 8.8  Hemoglobin 13.0 - 17.0 g/dL 16.0 73.7  Hematocrit 10.6 - 52.0 % 45.1 46.3  Platelets 150 - 400 K/uL 313 258    No results found for: TSH  ==================================================  COVID-19 Education: The signs and symptoms of COVID-19 were discussed with the patient and how to seek care for testing (follow up with PCP or arrange E-visit).    I spent a total of with the patient spent in direct patient consultation.  Additional time spent with chart review  / charting (studies, outside notes, etc): 16 min Total Time: 37 min  Current medicines are reviewed at length with the patient today.  (+/- concerns) none  This visit occurred during the SARS-CoV-2 public health emergency.  Safety protocols were in place, including screening questions prior to the visit, additional usage of staff PPE, and extensive cleaning of exam room while observing appropriate contact time as indicated for disinfecting solutions.  Notice: This dictation was prepared with Dragon dictation along with smaller phrase technology. Any transcriptional errors that result from this process are unintentional and may not be corrected upon review.  Patient Instructions / Medication Changes & Studies & Tests Ordered   Patient Instructions  Medication Instructions:   Toprol Xl ( metoprolol succinate  XL) 25 mg at lunch time - start after you completed lab work   *If you need a refill on your cardiac medications before your next appointment, please call your pharmacy*   Lab Work: Urine  and Serum  Catecholamine  If you have labs (blood work) drawn today and your tests are completely normal, you will receive your results only by: MyChart Message (if you have MyChart) OR A paper copy in the mail If you have any lab test that is abnormal or we need to change your treatment, we will call you to review the results.   Testing/Procedures: Not needed   Follow-Up: At [facility], you and your health needs are our priority.  As part of our continuing mission to provide you with exceptional heart care, we have created designated Provider Care Teams.  These Care Teams include your primary Cardiologist (physician) and Advanced Practice Providers (APPs -  Physician Assistants and Nurse Practitioners) who all work together to provide you with the care you need, when you need it.     Your next appointment:   3 month(s)  The format for your next appointment:   In Person  Provider:   Bryan Lemma, MD   Other Instructions    Studies Ordered:   Orders Placed This Encounter  Procedures   Catecholamines, fractionated, urine, 24 hour   Catecholamines, fractionated, plasma   EKG 12-Lead      Bryan Lemma, M.D., M.S. Interventional Cardiologist   Pager # [phone] Phone # [phone] [address] [place], [place] [address]   Thank you for choosing Heartcare at [facility]!!

## 2021-02-21 ENCOUNTER — Ambulatory Visit (INDEPENDENT_AMBULATORY_CARE_PROVIDER_SITE_OTHER): Payer: BLUE CROSS/BLUE SHIELD | Admitting: Cardiology

## 2021-02-21 ENCOUNTER — Other Ambulatory Visit: Payer: Self-pay

## 2021-02-21 ENCOUNTER — Other Ambulatory Visit (HOSPITAL_BASED_OUTPATIENT_CLINIC_OR_DEPARTMENT_OTHER): Payer: Self-pay

## 2021-02-21 ENCOUNTER — Encounter: Payer: Self-pay | Admitting: Cardiology

## 2021-02-21 VITALS — BP 123/81 | HR 66 | Ht 74.0 in | Wt 244.0 lb

## 2021-02-21 DIAGNOSIS — R079 Chest pain, unspecified: Secondary | ICD-10-CM | POA: Diagnosis not present

## 2021-02-21 DIAGNOSIS — R002 Palpitations: Secondary | ICD-10-CM

## 2021-02-21 DIAGNOSIS — R030 Elevated blood-pressure reading, without diagnosis of hypertension: Secondary | ICD-10-CM

## 2021-02-21 MED ORDER — METOPROLOL SUCCINATE ER 25 MG PO TB24
25.0000 mg | ORAL_TABLET | Freq: Every day | ORAL | 3 refills | Status: DC
  Filled 2021-02-21: qty 90, 90d supply, fill #0
  Filled 2021-07-12: qty 90, 90d supply, fill #1
  Filled 2021-11-15: qty 90, 90d supply, fill #2
  Filled 2022-02-07: qty 90, 90d supply, fill #3

## 2021-02-21 NOTE — Patient Instructions (Addendum)
Medication Instructions:   Toprol Xl ( metoprolol succinate  XL) 25 mg at lunch time - start after you completed lab work   *If you need a refill on your cardiac medications before your next appointment, please call your pharmacy*   Lab Work: Urine  and Serum  Catecholamine  If you have labs (blood work) drawn today and your tests are completely normal, you will receive your results only by: MyChart Message (if you have MyChart) OR A paper copy in the mail If you have any lab test that is abnormal or we need to change your treatment, we will call you to review the results.   Testing/Procedures: Not needed   Follow-Up: At [facility], you and your health needs are our priority.  As part of our continuing mission to provide you with exceptional heart care, we have created designated Provider Care Teams.  These Care Teams include your primary Cardiologist (physician) and Advanced Practice Providers (APPs -  Physician Assistants and Nurse Practitioners) who all work together to provide you with the care you need, when you need it.     Your next appointment:   3 month(s)  The format for your next appointment:   In Person  Provider:   Bryan Lemma, MD   Other Instructions

## 2021-02-24 LAB — CATECHOLAMINES, FRACTIONATED, PLASMA
Dopamine: <30 pg/mL (ref 0–48)
Epinephrine: 34 pg/mL (ref 0–62)
Norepinephrine: 430 pg/mL (ref 0–874)

## 2021-02-26 ENCOUNTER — Encounter: Payer: Self-pay | Admitting: Cardiology

## 2021-02-26 NOTE — Assessment & Plan Note (Signed)
Previously evaluated with a GXT that was nonischemic.  He is not actively having exertional chest pain.  The symptoms he is having is not at rest and often associated with his tachycardia.  I suspect he may just be feeling discomfort from the tachycardia.

## 2021-02-26 NOTE — Assessment & Plan Note (Signed)
Previous event monitor only showed short little bursts of SVT.  Nothing long enough to explain the symptoms he is currently having.  He could indeed be having episodic PSVT now which could explain symptoms.  Interesting though with his hypertension associated with the tachycardia a and symptoms of feeling flushed, and lightheaded would like to exclude Feo.  Plan: Check serum and urine catecholamine levels.  Once labs are checked, we will start him on Toprol 25 mg daily.

## 2021-02-26 NOTE — Assessment & Plan Note (Signed)
He does have episodic hypertension.  The spells do not seem to be back consistent with pheochromocytoma, however he is having spells of significant tachycardia with hypertension and feeling flushed and dizzy.  We will check urine and serum catecholamines and then start him on low-dose beta-blocker.  Would not want to be overly aggressive because his resting heart rate is only 66 bpm.

## 2021-02-28 ENCOUNTER — Other Ambulatory Visit: Payer: Self-pay | Admitting: Cardiology

## 2021-03-03 ENCOUNTER — Other Ambulatory Visit (HOSPITAL_BASED_OUTPATIENT_CLINIC_OR_DEPARTMENT_OTHER): Payer: Self-pay

## 2021-03-03 MED ORDER — ALPRAZOLAM 0.5 MG PO TABS
ORAL_TABLET | ORAL | 5 refills | Status: DC
  Filled 2021-03-03: qty 30, 15d supply, fill #0
  Filled 2021-04-06: qty 30, 15d supply, fill #1
  Filled 2021-05-26: qty 30, 15d supply, fill #2
  Filled 2021-07-12: qty 30, 15d supply, fill #3
  Filled 2021-08-25: qty 30, 15d supply, fill #4

## 2021-03-03 MED ORDER — BUPROPION HCL ER (XL) 150 MG PO TB24
ORAL_TABLET | ORAL | 0 refills | Status: DC
  Filled 2021-03-03: qty 30, 30d supply, fill #0

## 2021-03-07 LAB — CATECHOLAMINES, FRACTIONATED, URINE, 24 HOUR
Dopamine , 24H Ur: 241 ug/24 hr (ref 0–510)
Dopamine, Rand Ur: 119 ug/L
Epinephrine, 24H Ur: 4 ug/24 hr (ref 0–20)
Epinephrine, Rand Ur: 2 ug/L
Norepinephrine, 24H Ur: 34 ug/24 hr (ref 0–135)
Norepinephrine, Rand Ur: 17 ug/L

## 2021-04-06 ENCOUNTER — Other Ambulatory Visit (HOSPITAL_BASED_OUTPATIENT_CLINIC_OR_DEPARTMENT_OTHER): Payer: Self-pay

## 2021-04-07 ENCOUNTER — Other Ambulatory Visit (HOSPITAL_BASED_OUTPATIENT_CLINIC_OR_DEPARTMENT_OTHER): Payer: Self-pay

## 2021-04-07 MED ORDER — RIZATRIPTAN BENZOATE 10 MG PO TABS
ORAL_TABLET | ORAL | 3 refills | Status: AC
  Filled 2021-04-07: qty 9, 30d supply, fill #0
  Filled 2021-05-26: qty 9, 30d supply, fill #1

## 2021-04-07 MED ORDER — CYCLOBENZAPRINE HCL 10 MG PO TABS
ORAL_TABLET | ORAL | 3 refills | Status: AC
  Filled 2021-04-07: qty 30, 30d supply, fill #0
  Filled 2021-05-26: qty 30, 30d supply, fill #1
  Filled 2021-08-25: qty 30, 30d supply, fill #2

## 2021-05-26 ENCOUNTER — Other Ambulatory Visit (HOSPITAL_BASED_OUTPATIENT_CLINIC_OR_DEPARTMENT_OTHER): Payer: Self-pay

## 2021-05-26 MED ORDER — AMOXICILLIN-POT CLAVULANATE 875-125 MG PO TABS
ORAL_TABLET | ORAL | 0 refills | Status: DC
  Filled 2021-05-26: qty 20, 10d supply, fill #0

## 2021-05-26 MED ORDER — HYDROCOD POLST-CPM POLST ER 10-8 MG/5ML PO SUER
ORAL | 0 refills | Status: DC
  Filled 2021-05-26: qty 50, 5d supply, fill #0

## 2021-05-26 MED ORDER — BENZONATATE 100 MG PO CAPS
ORAL_CAPSULE | ORAL | 0 refills | Status: DC
  Filled 2021-05-26: qty 30, 10d supply, fill #0

## 2021-06-28 ENCOUNTER — Other Ambulatory Visit (HOSPITAL_BASED_OUTPATIENT_CLINIC_OR_DEPARTMENT_OTHER): Payer: Self-pay

## 2021-06-28 MED ORDER — PREDNISONE 10 MG PO TABS
ORAL_TABLET | ORAL | 0 refills | Status: DC
  Filled 2021-06-28: qty 21, 6d supply, fill #0

## 2021-06-28 MED ORDER — BENZONATATE 100 MG PO CAPS
100.0000 mg | ORAL_CAPSULE | Freq: Three times a day (TID) | ORAL | 0 refills | Status: DC
  Filled 2021-06-28: qty 30, 10d supply, fill #0

## 2021-06-28 MED ORDER — AZITHROMYCIN 250 MG PO TABS
ORAL_TABLET | ORAL | 0 refills | Status: DC
  Filled 2021-06-28: qty 6, 5d supply, fill #0

## 2021-07-06 ENCOUNTER — Ambulatory Visit: Payer: BLUE CROSS/BLUE SHIELD | Admitting: Cardiology

## 2021-07-06 ENCOUNTER — Encounter: Payer: Self-pay | Admitting: Cardiology

## 2021-07-06 ENCOUNTER — Other Ambulatory Visit: Payer: Self-pay

## 2021-07-06 VITALS — BP 136/90 | HR 76 | Ht 74.0 in | Wt 260.0 lb

## 2021-07-06 DIAGNOSIS — R030 Elevated blood-pressure reading, without diagnosis of hypertension: Secondary | ICD-10-CM

## 2021-07-06 DIAGNOSIS — R079 Chest pain, unspecified: Secondary | ICD-10-CM | POA: Diagnosis not present

## 2021-07-06 DIAGNOSIS — R002 Palpitations: Secondary | ICD-10-CM | POA: Diagnosis not present

## 2021-07-06 NOTE — Progress Notes (Addendum)
Primary Care Provider: Shirlyn Goltz (Inactive) Cardiologist: None Electrophysiologist: None  Clinic Note: Chief Complaint  Patient presents with   Follow-up    Doing well.  Blood pressure usually better than it is today.  No major complaints.    ===================================  ASSESSMENT/PLAN   Problem List Items Addressed This Visit     Rapid palpitations    No signs of pheochromocytoma.  Doing well on low-dose beta-blocker.  Very rare episodes of PRN alprazolam..      Chest pain with normal EKG (Chronic)    No further chest pain.  Did well on GXT.  Continue to monitor. As he gets closer to 52, can consider ordering calcium score testing for risk stratification      Elevated blood pressure reading - Primary (Chronic)    Seemingly episodic hypertension.  He gets anxious and excited this Drawbrdige is blood pressure and heart rate.  Pretty well controlled on Toprol.  He says at home it is relaxed, blood pressure stable.  Monitor closely, low threshold to consider adding ARB.  Significant.       ===================================  HPI:    Randy Scott is a 45 y.o. male with a PMH notable for anxiety and Stage I HTN, and Palpitations (short bursts of PAT/PSVT noted on the monitor December 2021) who presents today for 49-month follow-up.  Recent Hospitalizations: None  VADIM CENTOLA was last seen on February 21, 2021 at the request of Tera Helper, [place] for evaluation of Chest Pain with Exertion and Palpitations.  He had an episode of chest comfort and rapid heart rate while walking around the car dealership trying to buy a car for his daughter. - HR went up to 100 bpm.   Reviewed  CV studies:    The following studies were reviewed today: (if available, images/films reviewed: From Epic Chart or Care Everywhere) None:   Interval History:   ISIAHA Scott returns to clinic for routine follow-up to discuss his blood pressure levels.  He says that the pressure  today of 136/90 is pretty high for him but he was rushing to get in here.  He says at home his pressures have been running on average in the 120s / 80s.  He really has not had any of the tachycardia spells since last visit.  No further chest pain or pressure episodes.  Still has chronic mild end of day swelling that goes down with foot elevation.  He also wears support socks during the day -> is on his feet all day long. No further chest pain or pressure at rest or exertion.  Rare palpitations but nothing prolonged and no rapid heartbeats.  No longer having dizzy spells since being on Toprol..  CV Review of Symptoms (Summary) Cardiovascular ROS: no chest pain or dyspnea on exertion positive for - -much less frequent palpitations negative for - edema, irregular heartbeat, orthopnea, palpitations, paroxysmal nocturnal dyspnea, rapid heart rate, shortness of breath, or lightheadedness or dizziness, syncope/near syncope, TIA/amaurosis fugax, claudication  REVIEWED OF SYSTEMS   Review of Systems  Constitutional:  Positive for weight loss (He actually has gained quite a bit of weight.). Negative for fever and malaise/fatigue.  HENT:  Negative for nosebleeds.   Respiratory:  Negative for cough and shortness of breath.   Cardiovascular:  Positive for leg swelling (Stable). Negative for chest pain (No longer having any significant chest discomfort.) and claudication.  Gastrointestinal:  Negative for blood in stool, constipation and melena.  Genitourinary:  Negative  for hematuria.  Musculoskeletal:  Negative for joint pain.  Neurological:  Positive for dizziness. Negative for focal weakness and headaches.  Psychiatric/Behavioral:  Positive for memory loss. The patient is nervous/anxious (Takes Xanax about 3 times a week.  Seems to handle the palpitation issues.). The patient does not have insomnia.    I have reviewed and (if needed) personally updated the patient's problem list, medications, allergies,  past medical and surgical history, social and family history.   PAST MEDICAL HISTORY   Past Medical History:  Diagnosis Date   Generalized anxiety disorder with panic attacks    Ureterolithiasis     PAST SURGICAL HISTORY   Past Surgical History:  Procedure Laterality Date   88 DAY CARDIAC EVENT MONITOR  05/2020   Predominant rhythm: Sinus with heart rate range of 43 to 156 bpm.  Average 81 bpm.  2 short bursts of PVCs/Non-sustained VT - 4 beats (138 bpm) & 6 beats (150 bpm).  No sustained tachyarrhythmias - Afib, Atrial Flutter, SVT, PAT, or VT.   CYSTOSCOPY/URETEROSCOPY/HOLMIUM LASER/STENT PLACEMENT Left 03/03/2018   Procedure: CYSTOSCOPY/URETEROSCOPY/HOLMIUM LASER/STONE REMOVAL WITH BASKET/STENT PLACEMENT;  Surgeon: Sebastian Ache, MD;  Location: [facility];  Service: Urology;  Laterality: Left;   GRADUATED EXERCISE TREADMILL STRESS TEST (ETT/GXT)  03/30/2020   LOW RISK/NEGATIVE GXT. Exercised 12:11 min. 13.7 METS. HR from 95 to 169 bpm - 94% MPHR.  No ST-T wave changes. No CP. Excellent Exercise Capacity.     There is no immunization history on file for this patient.  MEDICATIONS/ALLERGIES   Current Meds  Medication Sig   ALPRAZolam (XANAX) 0.5 MG tablet Take 1 tablet by mouth 2 times daily as needed   benzonatate (TESSALON) 100 MG capsule Take 100 mg by mouth 3 (three) times daily as needed for cough.   Cyanocobalamin (VITAMIN B-12) 2500 MCG SUBL Take by mouth daily.    cyclobenzaprine (FLEXERIL) 10 MG tablet Take 1/2-1 tablet at bedtime as needed Orally Once a day 30 day(s)   metoprolol succinate (TOPROL XL) 25 MG 24 hr tablet Take 1 tablet (25 mg total) by mouth daily with lunch.   rizatriptan (MAXALT) 10 MG tablet Take 1/2 - 1 tablet Orally Once, can repeat in 2 hours if needed    No Known Allergies  SOCIAL HISTORY/FAMILY HISTORY   Reviewed in Epic:  Pertinent findings:  Social History   Tobacco Use   Smoking status: Never   Smokeless tobacco: Never  Substance Use  Topics   Alcohol use: Yes   Drug use: Never   Social History   Social History Narrative   Minimal caffeine use    OBJCTIVE -PE, EKG, labs   Wt Readings from Last 3 Encounters:  07/06/21 260 lb (117.9 kg)  02/21/21 244 lb (110.7 kg)  08/13/20 244 lb (110.7 kg)    Physical Exam: BP 136/90 (BP Location: Left Arm)   Pulse 76   Ht 6\' 2"  (1.88 m)   Wt 260 lb (117.9 kg)   SpO2 97%   BMI 33.38 kg/m  Physical Exam Vitals reviewed.  Constitutional:      General: He is not in acute distress.    Appearance: Normal appearance. He is obese. He is not ill-appearing or toxic-appearing.  HENT:     Head: Normocephalic and atraumatic.  Cardiovascular:     Rate and Rhythm: Normal rate and regular rhythm.     Pulses: Normal pulses.     Heart sounds: Normal heart sounds. No murmur heard.   No friction rub. No  gallop.  Pulmonary:     Effort: Pulmonary effort is normal.     Breath sounds: Normal breath sounds.  Chest:     Chest wall: No tenderness.  Musculoskeletal:        General: No swelling. Normal range of motion.     Cervical back: Normal range of motion and neck supple.  Skin:    General: Skin is warm and dry.  Neurological:     General: No focal deficit present.     Mental Status: He is alert and oriented to person, place, and time.     Gait: Gait normal.  Psychiatric:        Mood and Affect: Mood normal.        Behavior: Behavior normal.        Thought Content: Thought content normal.        Judgment: Judgment normal.     Adult ECG Report  N/a  Recent Labs:   03/30/2021: TC 186, TG 352, HDL 28, LDL 98.  Hgb 15, Cr 1.04, K+ 4.1. No results found for: CHOL, HDL, LDLCALC, LDLDIRECT, TRIG, CHOLHDL Lab Results  Component Value Date   CREATININE 1.31 (H) 03/03/2018   BUN 19 03/03/2018   NA 144 03/03/2018   K 4.5 03/03/2018   CL 106 03/03/2018   CO2 28 03/03/2018   CBC Latest Ref Rng & Units 03/03/2018 10/30/2009  WBC 4.0 - 10.5 K/uL 14.9(H) 8.8  Hemoglobin 13.0 -  17.0 g/dL 01.6 01.0  Hematocrit 93.2 - 52.0 % 45.1 46.3  Platelets 150 - 400 K/uL 313 258    No results found for: HGBA1C No results found for: TSH  ==================================================  COVID-19 Education: The signs and symptoms of COVID-19 were discussed with the patient and how to seek care for testing (follow up with PCP or arrange E-visit).    I spent a total of 12 minutes with the patient spent in direct patient consultation.  Additional time spent with chart review  / charting (studies, outside notes, etc): 10 min Total Time: 22 min  Current medicines are reviewed at length with the patient today.  (+/- concerns) N/A  This visit occurred during the SARS-CoV-2 public health emergency.  Safety protocols were in place, including screening questions prior to the visit, additional usage of staff PPE, and extensive cleaning of exam room while observing appropriate contact time as indicated for disinfecting solutions.  Notice: This dictation was prepared with Dragon dictation along with smart phrase technology. Any transcriptional errors that result from this process are unintentional and may not be corrected upon review.  Patient Instructions / Medication Changes & Studies & Tests Ordered   Patient Instructions  Medication Instructions:  No changes  *If you need a refill on your cardiac medications before your next appointment, please call your pharmacy*   Lab Work:  Not needed   Testing/Procedures:  Not needed  Follow-Up: At [facility], you and your health needs are our priority.  As part of our continuing mission to provide you with exceptional heart care, we have created designated Provider Care Teams.  These Care Teams include your primary Cardiologist (physician) and Advanced Practice Providers (APPs -  Physician Assistants and Nurse Practitioners) who all work together to provide you with the care you need, when you need it.     Your next  appointment:   12 month(s)  The format for your next appointment:   In Person  Provider:   Bryan Lemma, MD     Studies  Ordered:   No orders of the defined types were placed in this encounter.    Bryan Lemma, M.D., M.S. Interventional Cardiologist   Pager # [phone] Phone # [phone] [address] [place], [place] [address]   Thank you for choosing Heartcare at [facility]!!

## 2021-07-06 NOTE — Patient Instructions (Signed)

## 2021-07-13 ENCOUNTER — Other Ambulatory Visit (HOSPITAL_BASED_OUTPATIENT_CLINIC_OR_DEPARTMENT_OTHER): Payer: Self-pay

## 2021-08-05 ENCOUNTER — Encounter: Payer: Self-pay | Admitting: Cardiology

## 2021-08-05 NOTE — Assessment & Plan Note (Signed)
Seemingly episodic hypertension.  He gets anxious and excited this Drawbrdige is blood pressure and heart rate.  Pretty well controlled on Toprol.  He says at home it is relaxed, blood pressure stable.  Monitor closely, low threshold to consider adding ARB.  Significant.

## 2021-08-05 NOTE — Assessment & Plan Note (Addendum)
No further chest pain.  Did well on GXT.  Continue to monitor. As he gets closer to 52, can consider ordering calcium score testing for risk stratification

## 2021-08-05 NOTE — Assessment & Plan Note (Signed)
No signs of pheochromocytoma.  Doing well on low-dose beta-blocker.  Very rare episodes of PRN alprazolam..

## 2021-08-25 ENCOUNTER — Other Ambulatory Visit (HOSPITAL_BASED_OUTPATIENT_CLINIC_OR_DEPARTMENT_OTHER): Payer: Self-pay

## 2021-08-25 MED ORDER — PREDNISONE 10 MG PO TABS
ORAL_TABLET | ORAL | 0 refills | Status: DC
  Filled 2021-08-25: qty 18, 9d supply, fill #0

## 2021-08-25 MED ORDER — MELOXICAM 15 MG PO TABS
15.0000 mg | ORAL_TABLET | Freq: Every day | ORAL | 1 refills | Status: AC
  Filled 2021-08-25: qty 30, 30d supply, fill #0
  Filled 2022-02-08: qty 30, 30d supply, fill #1

## 2021-08-26 ENCOUNTER — Other Ambulatory Visit (HOSPITAL_BASED_OUTPATIENT_CLINIC_OR_DEPARTMENT_OTHER): Payer: Self-pay

## 2021-11-15 ENCOUNTER — Other Ambulatory Visit (HOSPITAL_BASED_OUTPATIENT_CLINIC_OR_DEPARTMENT_OTHER): Payer: Self-pay

## 2021-11-16 ENCOUNTER — Other Ambulatory Visit (HOSPITAL_BASED_OUTPATIENT_CLINIC_OR_DEPARTMENT_OTHER): Payer: Self-pay

## 2021-11-16 MED ORDER — ALPRAZOLAM 0.5 MG PO TABS
ORAL_TABLET | ORAL | 3 refills | Status: DC
  Filled 2021-11-16: qty 30, 15d supply, fill #0
  Filled 2022-02-07: qty 30, 15d supply, fill #1

## 2021-12-13 ENCOUNTER — Other Ambulatory Visit (HOSPITAL_BASED_OUTPATIENT_CLINIC_OR_DEPARTMENT_OTHER): Payer: Self-pay

## 2021-12-13 MED ORDER — PREDNISONE 10 MG PO TABS
ORAL_TABLET | ORAL | 0 refills | Status: DC
  Filled 2021-12-13: qty 21, 12d supply, fill #0

## 2022-01-13 ENCOUNTER — Other Ambulatory Visit (HOSPITAL_BASED_OUTPATIENT_CLINIC_OR_DEPARTMENT_OTHER): Payer: Self-pay

## 2022-02-07 ENCOUNTER — Other Ambulatory Visit (HOSPITAL_BASED_OUTPATIENT_CLINIC_OR_DEPARTMENT_OTHER): Payer: Self-pay

## 2022-02-08 ENCOUNTER — Other Ambulatory Visit (HOSPITAL_BASED_OUTPATIENT_CLINIC_OR_DEPARTMENT_OTHER): Payer: Self-pay

## 2022-07-06 ENCOUNTER — Other Ambulatory Visit: Payer: Self-pay

## 2022-07-06 ENCOUNTER — Encounter: Payer: Self-pay | Admitting: Nurse Practitioner

## 2022-07-06 ENCOUNTER — Ambulatory Visit: Payer: 59 | Attending: Nurse Practitioner | Admitting: Nurse Practitioner

## 2022-07-06 VITALS — BP 132/90 | HR 71 | Wt 256.6 lb

## 2022-07-06 DIAGNOSIS — R002 Palpitations: Secondary | ICD-10-CM

## 2022-07-06 DIAGNOSIS — R079 Chest pain, unspecified: Secondary | ICD-10-CM

## 2022-07-06 DIAGNOSIS — Z6832 Body mass index (BMI) 32.0-32.9, adult: Secondary | ICD-10-CM

## 2022-07-06 DIAGNOSIS — E669 Obesity, unspecified: Secondary | ICD-10-CM | POA: Diagnosis not present

## 2022-07-06 DIAGNOSIS — I10 Essential (primary) hypertension: Secondary | ICD-10-CM | POA: Diagnosis not present

## 2022-07-06 DIAGNOSIS — F419 Anxiety disorder, unspecified: Secondary | ICD-10-CM

## 2022-07-06 DIAGNOSIS — R030 Elevated blood-pressure reading, without diagnosis of hypertension: Secondary | ICD-10-CM

## 2022-07-06 MED ORDER — VALSARTAN 80 MG PO TABS: 80.0000 mg | ORAL_TABLET | Freq: Every day | ORAL | 3 refills | Status: DC

## 2022-07-06 NOTE — Patient Instructions (Signed)
Medication Instructions:  Start Valsartan 80 mg  *If you need a refill on your cardiac medications before your next appointment, please call your pharmacy*   Lab Work: Your physician recommends that you return for lab work in 2 weeks BMET  If you have labs (blood work) drawn today and your tests are completely normal, you will receive your results only by: [place] (if you have MyChart) OR A paper copy in the mail If you have any lab test that is abnormal or we need to change your treatment, we will call you to review the results.   Testing/Procedures: NONE ordered at this time of appointment     Follow-Up: At [facility], you and your health needs are our priority.  As part of our continuing mission to provide you with exceptional heart care, we have created designated Provider Care Teams.  These Care Teams include your primary Cardiologist (physician) and Advanced Practice Providers (APPs -  Physician Assistants and Nurse Practitioners) who all work together to provide you with the care you need, when you need it.  We recommend signing up for the patient portal called "MyChart".  Sign up information is provided on this After Visit Summary.  MyChart is used to connect with patients for Virtual Visits (Telemedicine).  Patients are able to view lab/test results, encounter notes, upcoming appointments, etc.  Non-urgent messages can be sent to your provider as well.   To learn more about what you can do with MyChart, go to [email or web address].    Your next appointment:   6-8 week(s)  The format for your next appointment:   In Person  Provider:   Diona Browner, NP        Other Instructions   Important Information About Sugar

## 2022-07-06 NOTE — Progress Notes (Signed)
Office Visit    Patient Name: Randy Scott Date of Encounter: 07/06/2022  Primary Care Provider:  Garth Bigness (Inactive) Primary Cardiologist:  Glenetta Hew, MD  Chief Complaint    46 year old male with a history of palpitations, PAT, PSVT, hypertension, and anxiety who presents for follow-up related to palpitations.  Past Medical History    Past Medical History:  Diagnosis Date   Generalized anxiety disorder with panic attacks    Ureterolithiasis    Past Surgical History:  Procedure Laterality Date   33 DAY CARDIAC EVENT MONITOR  05/2020   Predominant rhythm: Sinus with heart rate range of 43 to 156 bpm.  Average 81 bpm.  2 short bursts of PVCs/Non-sustained VT - 4 beats (138 bpm) & 6 beats (150 bpm).  No sustained tachyarrhythmias - Afib, Atrial Flutter, SVT, PAT, or VT.   CYSTOSCOPY/URETEROSCOPY/HOLMIUM LASER/STENT PLACEMENT Left 03/03/2018   Procedure: CYSTOSCOPY/URETEROSCOPY/HOLMIUM LASER/STONE REMOVAL WITH BASKET/STENT PLACEMENT;  Surgeon: Alexis Frock, MD;  Location: [facility];  Service: Urology;  Laterality: Left;   GRADUATED EXERCISE TREADMILL STRESS TEST (ETT/GXT)  03/30/2020   LOW RISK/NEGATIVE GXT. Exercised 12:11 min. 13.7 METS. HR from 95 to 169 bpm - 94% MPHR.  No ST-T wave changes. No CP. Excellent Exercise Capacity.    Allergies  No Known Allergies  History of Present Illness    46 year old male with the above past medical history including palpitations, PAT, PSVT, hypertension, and anxiety.  Patiently evaluated by Dr. Ellyn Hack  for the evaluation of chest pain, palpitations. ETT in 03/2020 was normal, revealed excellent exercise capacity. Cardiac monitor in 06/2020 showed predominantly sinus rhythm, PVCs, NSVT, bursts of PAT/PSVT.  He was started on beta-blocker therapy.  Beta-blocker dosing was subsequently decreased in the setting of dizziness. He was last seen in the office on 07/06/2021 and was stable from a cardiac standpoint.  He was  concerned that his BP was elevated. He denied chest pain or worsening palpitations.  He presents today for follow-up.  Since his last visit he has been stable from a cardiac standpoint.  He denies any chest pain, palpitations, dyspnea.  His BP has remained borderline elevated at home.  He is concerned that he may require BP medication.  He is asking whether or not he would benefit from statin therapy at this time.  Other than his elevated BP, he reports feeling well and denies any additional concerns today.  Home Medications    Current Outpatient Medications  Medication Sig Dispense Refill   ALPRAZolam (XANAX) 0.5 MG tablet Take 1 tablet by mouth 2 times daily as needed 30 tablet 3   cyclobenzaprine (FLEXERIL) 10 MG tablet Take 1/2-1 tablet at bedtime as needed Orally Once a day 30 day(s) 30 tablet 3   metoprolol succinate (TOPROL XL) 25 MG 24 hr tablet Take 1 tablet (25 mg total) by mouth daily with lunch. 90 tablet 3   rizatriptan (MAXALT) 10 MG tablet Take 1/2 - 1 tablet Orally Once, can repeat in 2 hours if needed 10 tablet 3   valsartan (DIOVAN) 80 MG tablet Take 1 tablet (80 mg total) by mouth daily. 90 tablet 3   benzonatate (TESSALON) 100 MG capsule Take 100 mg by mouth 3 (three) times daily as needed for cough. (Patient not taking: Reported on 07/06/2022)     Cyanocobalamin (VITAMIN B-12) 2500 MCG SUBL Take by mouth daily.      meloxicam (MOBIC) 15 MG tablet Take 1 tablet (15 mg total) by mouth daily. (Patient not taking:  Reported on 07/06/2022) 30 tablet 1   predniSONE (DELTASONE) 10 MG tablet Take 3 tablets for 3 days, then 2 tablets for 3 days, then 1 tablet for 3 days. 18 tablet 0   predniSONE (DELTASONE) 10 MG tablet Take 1 tablet by mouth 3 times daily for 2 days, then 1 tablet 2 times daily for 5 days, then 1 tablet daily until finished 21 tablet 0   No current facility-administered medications for this visit.     Review of Systems    He denies chest pain, palpitations,  dyspnea, pnd, orthopnea, n, v, dizziness, syncope, edema, weight gain, or early satiety. All other systems reviewed and are otherwise negative except as noted above.   Physical Exam    VS:  BP (!) 132/90 (BP Location: Left Arm, Patient Position: Sitting)   Pulse 71   Wt 256 lb 9.6 oz (116.4 kg)   SpO2 96%   BMI 32.95 kg/m   GEN: Well nourished, well developed, in no acute distress. HEENT: normal. Neck: Supple, no JVD, carotid bruits, or masses. Cardiac: RRR, no murmurs, rubs, or gallops. No clubbing, cyanosis, edema.  Radials/DP/PT 2+ and equal bilaterally.  Respiratory:  Respirations regular and unlabored, clear to auscultation bilaterally. GI: Soft, nontender, nondistended, BS + x 4. MS: no deformity or atrophy. Skin: warm and dry, no rash. Neuro:  Strength and sensation are intact. Psych: Normal affect.  Accessory Clinical Findings    ECG personally reviewed by me today -NSR, 71 bpm- no acute changes.   Lab Results  Component Value Date   WBC 14.9 (H) 03/03/2018   HGB 15.9 03/03/2018   HCT 45.1 03/03/2018   MCV 96.4 03/03/2018   PLT 313 03/03/2018   Lab Results  Component Value Date   CREATININE 1.31 (H) 03/03/2018   BUN 19 03/03/2018   NA 144 03/03/2018   K 4.5 03/03/2018   CL 106 03/03/2018   CO2 28 03/03/2018   Lab Results  Component Value Date   ALT 36 10/30/2009   AST 21 10/30/2009   ALKPHOS 78 10/30/2009   BILITOT 0.9 10/30/2009   No results found for: "CHOL", "HDL", "LDLCALC", "LDLDIRECT", "TRIG", "CHOLHDL"  No results found for: "HGBA1C"  Assessment & Plan    1. Palpitations: Cardiac monitor in 06/2020 showed predominantly sinus rhythm, PVCs, NSVT, bursts of PAT/PSVT.  He was started on beta-blocker therapy, dose was subsequently decreased due to dizziness.  He is tolerating his current dose of metoprolol.  Denies any recent palpitations.  Continue metoprolol.  2. Chest pain: ETT in 03/2020 was normal, excellent exercise capacity.  He denies any  recent chest pain. ASCVD risk score is 3.7. LDL was 97 in 04/2022.  No indication for statin therapy.   3. Hypertension: BP consistently elevated above goal. Will start valsartan 80 mg daily.  Will check BMET in 2 weeks.  Continue to monitor BP and report BP consistently greater than 130/80.  Continue metoprolol.  4. Obesity: Encouraged ongoing lifestyle modifications with diet and exercise.  5. Disposition: Follow-up in 6 to 8 weeks.  Joylene Grapes, NP 07/06/2022, 8:22 PM

## 2022-07-16 ENCOUNTER — Other Ambulatory Visit: Payer: Self-pay | Admitting: Cardiology

## 2022-07-16 ENCOUNTER — Other Ambulatory Visit (HOSPITAL_BASED_OUTPATIENT_CLINIC_OR_DEPARTMENT_OTHER): Payer: Self-pay

## 2022-07-17 ENCOUNTER — Other Ambulatory Visit (HOSPITAL_BASED_OUTPATIENT_CLINIC_OR_DEPARTMENT_OTHER): Payer: Self-pay

## 2022-07-17 MED ORDER — METOPROLOL SUCCINATE ER 25 MG PO TB24
25.0000 mg | ORAL_TABLET | Freq: Every day | ORAL | 3 refills | Status: DC
  Filled 2022-07-17: qty 90, 90d supply, fill #0

## 2022-07-17 MED ORDER — ALPRAZOLAM 0.5 MG PO TABS
0.5000 mg | ORAL_TABLET | Freq: Two times a day (BID) | ORAL | 3 refills | Status: AC | PRN
  Filled 2022-07-17: qty 30, 15d supply, fill #0

## 2022-07-21 LAB — BASIC METABOLIC PANEL
BUN/Creatinine Ratio: 18 (ref 9–20)
BUN: 19 mg/dL (ref 6–24)
CO2: 22 mmol/L (ref 20–29)
Calcium: 9.5 mg/dL (ref 8.7–10.2)
Chloride: 104 mmol/L (ref 96–106)
Creatinine, Ser: 1.04 mg/dL (ref 0.76–1.27)
Glucose: 86 mg/dL (ref 70–99)
Potassium: 4.5 mmol/L (ref 3.5–5.2)
Sodium: 142 mmol/L (ref 134–144)
eGFR: 91 mL/min/{1.73_m2} (ref >59)

## 2022-08-21 ENCOUNTER — Ambulatory Visit: Payer: 59 | Attending: Nurse Practitioner | Admitting: Nurse Practitioner

## 2022-08-21 ENCOUNTER — Encounter: Payer: Self-pay | Admitting: Nurse Practitioner

## 2022-08-21 VITALS — BP 122/80 | HR 97 | Ht 74.0 in | Wt 263.0 lb

## 2022-08-21 DIAGNOSIS — R079 Chest pain, unspecified: Secondary | ICD-10-CM | POA: Diagnosis not present

## 2022-08-21 DIAGNOSIS — Z6832 Body mass index (BMI) 32.0-32.9, adult: Secondary | ICD-10-CM

## 2022-08-21 DIAGNOSIS — R002 Palpitations: Secondary | ICD-10-CM

## 2022-08-21 DIAGNOSIS — E669 Obesity, unspecified: Secondary | ICD-10-CM

## 2022-08-21 DIAGNOSIS — I10 Essential (primary) hypertension: Secondary | ICD-10-CM

## 2022-08-21 NOTE — Patient Instructions (Signed)
Medication Instructions:  Your physician recommends that you continue on your current medications as directed. Please refer to the Current Medication list given to you today.   *If you need a refill on your cardiac medications before your next appointment, please call your pharmacy*   Lab Work: NONE ordered at this time of appointment   If you have labs (blood work) drawn today and your tests are completely normal, you will receive your results only by: MyChart Message (if you have MyChart) OR A paper copy in the mail If you have any lab test that is abnormal or we need to change your treatment, we will call you to review the results.   Testing/Procedures: NONE ordered at this time of appointment     Follow-Up: At [facility], you and your health needs are our priority.  As part of our continuing mission to provide you with exceptional heart care, we have created designated Provider Care Teams.  These Care Teams include your primary Cardiologist (physician) and Advanced Practice Providers (APPs -  Physician Assistants and Nurse Practitioners) who all work together to provide you with the care you need, when you need it.  We recommend signing up for the patient portal called "MyChart".  Sign up information is provided on this After Visit Summary.  MyChart is used to connect with patients for Virtual Visits (Telemedicine).  Patients are able to view lab/test results, encounter notes, upcoming appointments, etc.  Non-urgent messages can be sent to your provider as well.   To learn more about what you can do with MyChart, go to [email or web address].    Your next appointment:   1 year(s)  The format for your next appointment:   In Person  Provider:   David Harding, MD     Other Instructions   Important Information About Sugar       

## 2022-08-21 NOTE — Progress Notes (Addendum)
Office Visit    Patient Name: Randy Scott Date of Encounter: 08/21/2022  Primary Care Provider:  Shirlyn Goltz (Inactive) Primary Cardiologist:  Bryan Lemma, MD  Chief Complaint    46 year old male with a history of palpitations, PAT, PSVT, hypertension, and anxiety who presents for follow-up related to hypertension.   Past Medical History    Past Medical History:  Diagnosis Date   Generalized anxiety disorder with panic attacks    Ureterolithiasis    Past Surgical History:  Procedure Laterality Date   64 DAY CARDIAC EVENT MONITOR  05/2020   Predominant rhythm: Sinus with heart rate range of 43 to 156 bpm.  Average 81 bpm.  2 short bursts of PVCs/Non-sustained VT - 4 beats (138 bpm) & 6 beats (150 bpm).  No sustained tachyarrhythmias - Afib, Atrial Flutter, SVT, PAT, or VT.   CYSTOSCOPY/URETEROSCOPY/HOLMIUM LASER/STENT PLACEMENT Left 03/03/2018   Procedure: CYSTOSCOPY/URETEROSCOPY/HOLMIUM LASER/STONE REMOVAL WITH BASKET/STENT PLACEMENT;  Surgeon: Sebastian Ache, MD;  Location: [facility];  Service: Urology;  Laterality: Left;   GRADUATED EXERCISE TREADMILL STRESS TEST (ETT/GXT)  03/30/2020   LOW RISK/NEGATIVE GXT. Exercised 12:11 min. 13.7 METS. HR from 95 to 169 bpm - 94% MPHR.  No ST-T wave changes. No CP. Excellent Exercise Capacity.    Allergies  No Known Allergies  History of Present Illness    46 year old male with the above past medical history including palpitations, PAT, PSVT, hypertension, and anxiety.   Previously evaluated by Dr. Herbie Baltimore in the setting of chest pain and palpitations. ETT in 03/2020 was normal, revealed excellent exercise capacity. Cardiac monitor in 06/2020 showed predominantly sinus rhythm, PVCs, NSVT, bursts of PAT/PSVT.  He was started on beta-blocker therapy.  Beta-blocker dosing was subsequently decreased in the setting of dizziness. He was last seen in the office on 07/06/2022 and was stable from a cardiac standpoint.  He noted  elevated BP.  He was started on valsartan 80 mg daily.  He presents today for follow-up.  Since his last visit he has done well from a cardiac standpoint.  He denies any chest pain palpitations.  BP has been well-controlled.  Overall, he reports feeling well.  Home Medications    Current Outpatient Medications  Medication Sig Dispense Refill   ALPRAZolam (XANAX) 0.5 MG tablet Take 1 tablet (0.5 mg total) by mouth 2 (two) times daily as needed. 30 tablet 3   cyclobenzaprine (FLEXERIL) 10 MG tablet Take 1/2-1 tablet at bedtime as needed Orally Once a day 30 day(s) 30 tablet 3   meloxicam (MOBIC) 15 MG tablet Take 1 tablet (15 mg total) by mouth daily. 30 tablet 1   metoprolol succinate (TOPROL XL) 25 MG 24 hr tablet Take 1 tablet (25 mg total) by mouth daily with lunch. 90 tablet 3   rizatriptan (MAXALT) 10 MG tablet Take 1/2 - 1 tablet Orally Once, can repeat in 2 hours if needed 10 tablet 3   valsartan (DIOVAN) 80 MG tablet Take 1 tablet (80 mg total) by mouth daily. 90 tablet 3   No current facility-administered medications for this visit.     Review of Systems    He denies chest pain, palpitations, dyspnea, pnd, orthopnea, n, v, dizziness, syncope, edema, weight gain, or early satiety. All other systems reviewed and are otherwise negative except as noted above.   Physical Exam    VS:  BP 122/80   Pulse 97   Ht 6\' 2"  (1.88 m)   Wt 263 lb (119.3 kg)  SpO2 97%   BMI 33.77 kg/m  GEN: Well nourished, well developed, in no acute distress. HEENT: normal. Neck: Supple, no JVD, carotid bruits, or masses. Cardiac: RRR, no murmurs, rubs, or gallops. No clubbing, cyanosis, edema.  Radials/DP/PT 2+ and equal bilaterally.  Respiratory:  Respirations regular and unlabored, clear to auscultation bilaterally. GI: Soft, nontender, nondistended, BS + x 4. MS: no deformity or atrophy. Skin: warm and dry, no rash. Neuro:  Strength and sensation are intact. Psych: Normal affect.  Accessory  Clinical Findings    ECG personally reviewed by me today -no EKG in office today.   Lab Results  Component Value Date   WBC 14.9 (H) 03/03/2018   HGB 15.9 03/03/2018   HCT 45.1 03/03/2018   MCV 96.4 03/03/2018   PLT 313 03/03/2018   Lab Results  Component Value Date   CREATININE 1.04 07/20/2022   BUN 19 07/20/2022   NA 142 07/20/2022   K 4.5 07/20/2022   CL 104 07/20/2022   CO2 22 07/20/2022   Lab Results  Component Value Date   ALT 36 10/30/2009   AST 21 10/30/2009   ALKPHOS 78 10/30/2009   BILITOT 0.9 10/30/2009   No results found for: "CHOL", "HDL", "LDLCALC", "LDLDIRECT", "TRIG", "CHOLHDL"  No results found for: "HGBA1C"  Assessment & Plan    1. Hypertension: BP well controlled. Continue current antihypertensive regimen.   2. Chest pain: ETT in 03/2020 was normal, excellent exercise capacity.  He denies any recent chest pain. ASCVD risk score is 3.7. LDL was 97 in 04/2022.  No indication for statin therapy.    3. Palpitations: Cardiac monitor in 06/2020 showed predominantly sinus rhythm, PVCs, NSVT, bursts of PAT/PSVT.  He was started on beta-blocker therapy, dose was subsequently decreased due to dizziness.  He is tolerating his current dose of metoprolol. Denies any recent palpitations. Continue metoprolol.   4. Obesity: Encouraged ongoing lifestyle modifications with diet and exercise.   5. Disposition: Follow-up in 1 year.     Joylene Grapes, NP 08/21/2022, 5:30 PM

## 2022-09-06 ENCOUNTER — Other Ambulatory Visit (HOSPITAL_BASED_OUTPATIENT_CLINIC_OR_DEPARTMENT_OTHER): Payer: Self-pay

## 2022-10-24 ENCOUNTER — Other Ambulatory Visit (HOSPITAL_BASED_OUTPATIENT_CLINIC_OR_DEPARTMENT_OTHER): Payer: Self-pay

## 2023-06-26 ENCOUNTER — Telehealth: Payer: Self-pay | Admitting: *Deleted

## 2023-06-26 NOTE — Telephone Encounter (Signed)
Left message to call back to set up tele pre op appt.

## 2023-06-26 NOTE — Telephone Encounter (Signed)
Primary Cardiologist:David Herbie Baltimore, MD   Preoperative team, please contact this patient and set up a phone call appointment for further preoperative risk assessment. Please obtain consent and complete medication review. Thank you for your help.   I confirm that guidance regarding antiplatelet and oral anticoagulation therapy has been completed and, if necessary, noted below (none requested).  I also confirmed the patient resides in the state of [place]. As per [facility] Medical Board telemedicine laws, the patient must reside in the state in which the provider is licensed.   Levi Aland, NP-C  06/26/2023, 3:52 PM [address] Office [phone] Fax [phone]

## 2023-06-26 NOTE — Telephone Encounter (Signed)
Pre-operative Risk Assessment    Patient Name: Randy Scott  DOB: September 08, 1976 MRN: [id number]  DATE OF LAST VISIT: 08/21/22 Bernadene Person, NP DATE OF NEXT VISIT: 08/21/23 Bernadene Person, NP     Request for Surgical Clearance    Procedure:   COLONOSCOPY ; SCREENING  Date of Surgery:  Clearance 07/26/23                                 Surgeon:  DR. Dulce Sellar Surgeon's Group or Practice Name:  [facility] Phone number:  [phone] Fax number:  [phone]   Type of Clearance Requested:   - Medical ; NONE INDICATED TO BE HELD   Type of Anesthesia:   PROPOFOL    Additional requests/questions:    Elpidio Anis   06/26/2023, 1:45 PM

## 2023-06-27 ENCOUNTER — Telehealth: Payer: Self-pay | Admitting: *Deleted

## 2023-06-27 NOTE — Telephone Encounter (Signed)
Pt has been scheduled for tele pre op appt 07/12/23. Med rec and consent are done.     Patient Consent for Virtual Visit        Randy Scott has provided verbal consent on 06/27/2023 for a virtual visit (video or telephone).   CONSENT FOR VIRTUAL VISIT FOR:  Randy Scott  By participating in this virtual visit I agree to the following:  I hereby voluntarily request, consent and authorize [facility] and its employed or contracted physicians, physician assistants, nurse practitioners or other licensed health care professionals (the Practitioner), to provide me with telemedicine health care services (the "Services") as deemed necessary by the treating Practitioner. I acknowledge and consent to receive the Services by the Practitioner via telemedicine. I understand that the telemedicine visit will involve communicating with the Practitioner through live audiovisual communication technology and the disclosure of certain medical information by electronic transmission. I acknowledge that I have been given the opportunity to request an in-person assessment or other available alternative prior to the telemedicine visit and am voluntarily participating in the telemedicine visit.  I understand that I have the right to withhold or withdraw my consent to the use of telemedicine in the course of my care at any time, without affecting my right to future care or treatment, and that the Practitioner or I may terminate the telemedicine visit at any time. I understand that I have the right to inspect all information obtained and/or recorded in the course of the telemedicine visit and may receive copies of available information for a reasonable fee.  I understand that some of the potential risks of receiving the Services via telemedicine include:  Delay or interruption in medical evaluation due to technological equipment failure or disruption; Information transmitted may not be sufficient (e.g. poor  resolution of images) to allow for appropriate medical decision making by the Practitioner; and/or  In rare instances, security protocols could fail, causing a breach of personal health information.  Furthermore, I acknowledge that it is my responsibility to provide information about my medical history, conditions and care that is complete and accurate to the best of my ability. I acknowledge that Practitioner's advice, recommendations, and/or decision may be based on factors not within their control, such as incomplete or inaccurate data provided by me or distortions of diagnostic images or specimens that may result from electronic transmissions. I understand that the practice of medicine is not an exact science and that Practitioner makes no warranties or guarantees regarding treatment outcomes. I acknowledge that a copy of this consent can be made available to me via my patient portal [facility] MyChart), or I can request a printed copy by calling the office of [facility].    I understand that my insurance will be billed for this visit.   I have read or had this consent read to me. I understand the contents of this consent, which adequately explains the benefits and risks of the Services being provided via telemedicine.  I have been provided ample opportunity to ask questions regarding this consent and the Services and have had my questions answered to my satisfaction. I give my informed consent for the services to be provided through the use of telemedicine in my medical care

## 2023-06-27 NOTE — Telephone Encounter (Signed)
Pt has been scheduled for tele pre op appt 07/12/23. Med rec and consent are done.

## 2023-07-09 ENCOUNTER — Other Ambulatory Visit: Payer: Self-pay | Admitting: Nurse Practitioner

## 2023-07-11 ENCOUNTER — Other Ambulatory Visit: Payer: Self-pay | Admitting: Cardiology

## 2023-07-12 ENCOUNTER — Ambulatory Visit: Payer: 59 | Attending: Cardiology | Admitting: Student

## 2023-07-12 DIAGNOSIS — Z01810 Encounter for preprocedural cardiovascular examination: Secondary | ICD-10-CM | POA: Diagnosis not present

## 2023-07-12 NOTE — Progress Notes (Signed)
Virtual Visit via Telephone Note   Because of Randy Scott's co-morbid illnesses, he is at least at moderate risk for complications without adequate follow up.  This format is felt to be most appropriate for this patient at this time.  The patient did not have access to video technology/had technical difficulties with video requiring transitioning to audio format only (telephone).  All issues noted in this document were discussed and addressed.  No physical exam could be performed with this format.  Please refer to the patient's chart for his consent to telehealth for [facility].  Evaluation Performed:  Preoperative cardiovascular risk assessment _____________   Date:  07/12/2023   Patient ID:  Randy Scott, DOB 03-29-1976, MRN [id number] Patient Location:  Home Provider location:   Office  Primary Care Provider:  Shirlyn Goltz (Inactive) Primary Cardiologist:  Bryan Lemma, MD  Chief Complaint / Patient Profile   47 y.o. y/o male with a h/o palpitations, PAT, PSVT, hypertension, anxiety who is pending colonoscopy by Dr. Dulce Sellar and presents today for telephonic preoperative cardiovascular risk assessment.  History of Present Illness    Randy Scott is a 47 y.o. male who presents via audio/video conferencing for a telehealth visit today.  Pt was last seen in cardiology clinic on 08/21/2022 by Bernadene Person, NP.  At that time Randy Scott was stable from a cardiac standpoint.  The patient is now pending procedure as outlined above. Since his last visit, he is doing well. Patient denies shortness of breath, dyspnea on exertion, lower extremity edema, orthopnea or PND. No chest pain, pressure, or tightness. No palpitations.  He is very active running/walking 3 miles 5 days a week.   Past Medical History    Past Medical History:  Diagnosis Date   Generalized anxiety disorder with panic attacks    Ureterolithiasis    Past Surgical History:  Procedure Laterality  Date   47 DAY CARDIAC EVENT MONITOR  05/2020   Predominant rhythm: Sinus with heart rate range of 43 to 156 bpm.  Average 81 bpm.  2 short bursts of PVCs/Non-sustained VT - 4 beats (138 bpm) & 6 beats (150 bpm).  No sustained tachyarrhythmias - Afib, Atrial Flutter, SVT, PAT, or VT.   CYSTOSCOPY/URETEROSCOPY/HOLMIUM LASER/STENT PLACEMENT Left 03/03/2018   Procedure: CYSTOSCOPY/URETEROSCOPY/HOLMIUM LASER/STONE REMOVAL WITH BASKET/STENT PLACEMENT;  Surgeon: Sebastian Ache, MD;  Location: [facility];  Service: Urology;  Laterality: Left;   GRADUATED EXERCISE TREADMILL STRESS TEST (ETT/GXT)  03/30/2020   LOW RISK/NEGATIVE GXT. Exercised 12:11 min. 13.7 METS. HR from 95 to 169 bpm - 94% MPHR.  No ST-T wave changes. No CP. Excellent Exercise Capacity.    Allergies  No Known Allergies  Home Medications    Prior to Admission medications   Medication Sig Start Date End Date Taking? Authorizing Provider  ALPRAZolam Prudy Feeler) 0.5 MG tablet Take 1 tablet (0.5 mg total) by mouth 2 (two) times daily as needed. 07/16/22     cyclobenzaprine (FLEXERIL) 10 MG tablet Take 1/2-1 tablet at bedtime as needed Orally Once a day 30 day(s) 04/07/21     meloxicam (MOBIC) 15 MG tablet Take 1 tablet (15 mg total) by mouth daily. Patient not taking: Reported on 06/27/2023 08/25/21     metoprolol succinate (TOPROL XL) 25 MG 24 hr tablet Take 1 tablet (25 mg total) by mouth daily with lunch. Patient not taking: Reported on 06/27/2023 07/17/22   Marykay Lex, MD  rizatriptan (MAXALT) 10 MG tablet Take 1/2 -  1 tablet Orally Once, can repeat in 2 hours if needed Patient not taking: Reported on 06/27/2023 04/07/21     valsartan (DIOVAN) 80 MG tablet TAKE 1 TABLET BY MOUTH EVERY DAY 07/09/23   Joylene Grapes, NP    Physical Exam    Vital Signs:  Randy Scott does not have vital signs available for review today.  Given telephonic nature of communication, physical exam is limited. AAOx3. NAD. Normal affect.  Speech and  respirations are unlabored.  Accessory Clinical Findings    None  Assessment & Plan    Primary Cardiologist: Bryan Lemma, MD  Preoperative cardiovascular risk assessment.  Colonoscopy by Dr. Dulce Sellar on 07/26/2023.  Chart reviewed as part of pre-operative protocol coverage. According to the RCRI, patient has a 0.4% risk of MACE. Patient reports activity equivalent to >4.0 METS (runs/walks 3 miles 5 days a week).   Given past medical history and time since last visit, based on ACC/AHA guidelines, Randy Scott would be at acceptable risk for the planned procedure without further cardiovascular testing.   Patient was advised that if he develops new symptoms prior to surgery to contact our office to arrange a follow-up appointment.  he verbalized understanding.   I will route this recommendation to the requesting party via Epic fax function.  Please call with questions.  Time:   Today, I have spent 5 minutes with the patient with telehealth technology discussing medical history, symptoms, and management plan.     Carlos Levering, NP  07/12/2023, 8:06 AM

## 2023-08-21 ENCOUNTER — Ambulatory Visit: Payer: 59 | Admitting: Nurse Practitioner

## 2023-10-02 ENCOUNTER — Other Ambulatory Visit: Payer: Self-pay | Admitting: Cardiology

## 2023-10-03 ENCOUNTER — Other Ambulatory Visit: Payer: Self-pay

## 2023-10-04 ENCOUNTER — Ambulatory Visit: Payer: Managed Care, Other (non HMO) | Attending: Nurse Practitioner | Admitting: Nurse Practitioner

## 2023-10-04 ENCOUNTER — Encounter: Payer: Self-pay | Admitting: Nurse Practitioner

## 2023-10-04 VITALS — BP 118/86 | HR 83 | Ht 74.0 in | Wt 255.2 lb

## 2023-10-04 DIAGNOSIS — E66811 Obesity, class 1: Secondary | ICD-10-CM | POA: Diagnosis not present

## 2023-10-04 DIAGNOSIS — Z6832 Body mass index (BMI) 32.0-32.9, adult: Secondary | ICD-10-CM

## 2023-10-04 DIAGNOSIS — R079 Chest pain, unspecified: Secondary | ICD-10-CM

## 2023-10-04 DIAGNOSIS — R002 Palpitations: Secondary | ICD-10-CM | POA: Diagnosis not present

## 2023-10-04 DIAGNOSIS — I10 Essential (primary) hypertension: Secondary | ICD-10-CM | POA: Diagnosis not present

## 2023-10-04 NOTE — Patient Instructions (Signed)
Medication Instructions:  Your physician recommends that you continue on your current medications as directed. Please refer to the Current Medication list given to you today.  *If you need a refill on your cardiac medications before your next appointment, please call your pharmacy*   Lab Work: NONE ordered at this time of appointment   Testing/Procedures: NONE ordered at this time of appointment   Follow-Up: At [facility], you and your health needs are our priority.  As part of our continuing mission to provide you with exceptional heart care, we have created designated Provider Care Teams.  These Care Teams include your primary Cardiologist (physician) and Advanced Practice Providers (APPs -  Physician Assistants and Nurse Practitioners) who all work together to provide you with the care you need, when you need it.  We recommend signing up for the patient portal called "MyChart".  Sign up information is provided on this After Visit Summary.  MyChart is used to connect with patients for Virtual Visits (Telemedicine).  Patients are able to view lab/test results, encounter notes, upcoming appointments, etc.  Non-urgent messages can be sent to your provider as well.   To learn more about what you can do with MyChart, go to [email or web address].    Your next appointment:   1 year(s)  Provider:   Bryan Lemma, MD

## 2023-10-04 NOTE — Progress Notes (Signed)
Office Visit    Patient Name: Randy Scott Date of Encounter: 10/04/2023  Primary Care Provider:  Shirlyn Goltz (Inactive) Primary Cardiologist:  Bryan Lemma, MD  Chief Complaint    48 year old male with a history of palpitations, PAT, PSVT, hypertension, and anxiety who presents for follow-up related to hypertension.   Past Medical History    Past Medical History:  Diagnosis Date   Generalized anxiety disorder with panic attacks    Ureterolithiasis    Past Surgical History:  Procedure Laterality Date   78 DAY CARDIAC EVENT MONITOR  05/2020   Predominant rhythm: Sinus with heart rate range of 43 to 156 bpm.  Average 81 bpm.  2 short bursts of PVCs/Non-sustained VT - 4 beats (138 bpm) & 6 beats (150 bpm).  No sustained tachyarrhythmias - Afib, Atrial Flutter, SVT, PAT, or VT.   CYSTOSCOPY/URETEROSCOPY/HOLMIUM LASER/STENT PLACEMENT Left 03/03/2018   Procedure: CYSTOSCOPY/URETEROSCOPY/HOLMIUM LASER/STONE REMOVAL WITH BASKET/STENT PLACEMENT;  Surgeon: Sebastian Ache, MD;  Location: [facility];  Service: Urology;  Laterality: Left;   GRADUATED EXERCISE TREADMILL STRESS TEST (ETT/GXT)  03/30/2020   LOW RISK/NEGATIVE GXT. Exercised 12:11 min. 13.7 METS. HR from 95 to 169 bpm - 94% MPHR.  No ST-T wave changes. No CP. Excellent Exercise Capacity.    Allergies  No Known Allergies   Labs/Other Studies Reviewed    The following studies were reviewed today:  Cardiac Studies & Procedures     STRESS TESTS  EXERCISE TOLERANCE TEST (ETT) 03/30/2020  Narrative  Blood pressure demonstrated a normal response to exercise.  No T wave inversion was noted during stress.  There was no ST segment deviation noted during stress.  Overall, the patient's exercise capacity was excellent  [facility] Treadmill Score: low risk  Negative stress test without evidence of ischemia at given workload. Excellent exercise capacity.    MONITORS  CARDIAC EVENT MONITOR 06/30/2020  Narrative   Predominant rhythm sinus: Minimum heart rate 43 bpm, maximum heart rate 156 bpm. Average heart rate 81 bpm.  No evidence of A. fib or any prolonged arrhythmias.  1 run of 4 PVCs-by definition VT-rate 138 bpm; second run of 6 PVCs (NSVT with a rate of Roughly 150 bpm  Monitor worn from September 20-June 29, 2020  15 stable events noted: 2 automatically detected episodes with 4 beats of VT followed by another 1 of 3-6 beats VT. 12 manually detected events either sinus rhythm or sinus arrhythmia versus sinus tachycardia.  No ectopy or arrhythmia noted.   For the most part, relatively normal monitor.  Would not do much about the short little bursts of " Ventricular Tachycardia"(4 & 6 beats most likely benign).    Bryan Lemma, MD          Recent Labs: No results found for requested labs within last 365 days.  Recent Lipid Panel No results found for: "CHOL", "TRIG", "HDL", "CHOLHDL", "VLDL", "LDLCALC", "LDLDIRECT"  History of Present Illness    48 year old male with the above past medical history including palpitations, PAT, PSVT, hypertension, and anxiety.   He was previously evaluated by Dr. Herbie Baltimore in the setting of chest pain and palpitations. ETT in 03/2020 was normal, revealed excellent exercise capacity. Cardiac monitor in 06/2020 showed predominantly sinus rhythm, PVCs, NSVT, bursts of PAT/PSVT.  He was started on beta-blocker therapy. Beta-blocker dosing was subsequently decreased in the setting of dizziness, and later discontinued. He was last seen in the office on 08/21/2022 and was stable from a cardiac standpoint.  He was  seen virtually for preoperative cardiac evaluation on 07/12/2023 and was doing well.   He presents today for follow-up.  Since his last visit he has done well from a cardiac standpoint.  BP has been well-controlled.  He denies any symptoms concerning for angina.  He is working on losing weight.  Overall, he reports feeling well.  Home Medications     Current Outpatient Medications  Medication Sig Dispense Refill   ALPRAZolam (XANAX) 0.5 MG tablet Take 1 tablet (0.5 mg total) by mouth 2 (two) times daily as needed. 30 tablet 3   valsartan (DIOVAN) 80 MG tablet TAKE 1 TABLET BY MOUTH EVERY DAY 90 tablet 3   cyclobenzaprine (FLEXERIL) 10 MG tablet Take 1/2-1 tablet at bedtime as needed Orally Once a day 30 day(s) 30 tablet 3   meloxicam (MOBIC) 15 MG tablet Take 1 tablet (15 mg total) by mouth daily. (Patient not taking: Reported on 06/27/2023) 30 tablet 1   metoprolol succinate (TOPROL-XL) 25 MG 24 hr tablet TAKE 1 TABLET BY MOUTH EVERY DAY WITH LUNCH 90 tablet 0   rizatriptan (MAXALT) 10 MG tablet Take 1/2 - 1 tablet Orally Once, can repeat in 2 hours if needed (Patient not taking: Reported on 06/27/2023) 10 tablet 3   No current facility-administered medications for this visit.     Review of Systems    He denies chest pain, palpitations, dyspnea, pnd, orthopnea, n, v, dizziness, syncope, edema, weight gain, or early satiety. All other systems reviewed and are otherwise negative except as noted above.   Physical Exam    VS:  BP 118/86 (BP Location: Left Arm, Patient Position: Sitting, Cuff Size: Large)   Pulse 83   Ht 6\' 2"  (1.88 m)   Wt 255 lb 3.2 oz (115.8 kg)   SpO2 96%   BMI 32.77 kg/m   GEN: Well nourished, well developed, in no acute distress. HEENT: normal. Neck: Supple, no JVD, carotid bruits, or masses. Cardiac: RRR, no murmurs, rubs, or gallops. No clubbing, cyanosis, edema.  Radials/DP/PT 2+ and equal bilaterally.  Respiratory:  Respirations regular and unlabored, clear to auscultation bilaterally. GI: Soft, nontender, nondistended, BS + x 4. MS: no deformity or atrophy. Skin: warm and dry, no rash. Neuro:  Strength and sensation are intact. Psych: Normal affect.  Accessory Clinical Findings    ECG personally reviewed by me today - EKG Interpretation Date/Time:  Thursday October 04 2023 15:23:01 EST Ventricular  Rate:  83 PR Interval:  164 QRS Duration:  96 QT Interval:  390 QTC Calculation: 458 R Axis:   179  Text Interpretation: Normal sinus rhythm Right ventricular hypertrophy No significant change since last tracing Confirmed by Bernadene Person ([id number]) on 10/04/2023 4:01:40 PM  - no acute changes.   Lab Results  Component Value Date   WBC 14.9 (H) 03/03/2018   HGB 15.9 03/03/2018   HCT 45.1 03/03/2018   MCV 96.4 03/03/2018   PLT 313 03/03/2018   Lab Results  Component Value Date   CREATININE 1.04 07/20/2022   BUN 19 07/20/2022   NA 142 07/20/2022   K 4.5 07/20/2022   CL 104 07/20/2022   CO2 22 07/20/2022   Lab Results  Component Value Date   ALT 36 10/30/2009   AST 21 10/30/2009   ALKPHOS 78 10/30/2009   BILITOT 0.9 10/30/2009   No results found for: "CHOL", "HDL", "LDLCALC", "LDLDIRECT", "TRIG", "CHOLHDL"  No results found for: "HGBA1C"  Assessment & Plan    1. Hypertension: BP well controlled. Continue  current antihypertensive regimen.    2. Chest pain: ETT in 03/2020 was normal, excellent exercise capacity.  He denies any recent chest pain. ASCVD risk score is 3.7. LDL was 116 in 04/2023.  No indication for statin therapy.  Monitored and managed per PCP.  3. Palpitations: Cardiac monitor in 06/2020 showed predominantly sinus rhythm, PVCs, NSVT, bursts of PAT/PSVT.  He was started on beta-blocker therapy, this was ultimately discontinued. Denies any recent palpitations.    4. Obesity: Encouraged ongoing lifestyle modifications with diet and exercise.   5. Disposition: Follow-up in 1 year.       Joylene Grapes, NP 10/04/2023, 7:38 PM

## 2024-08-23 ENCOUNTER — Other Ambulatory Visit: Payer: Self-pay | Admitting: Nurse Practitioner

## 2024-08-29 ENCOUNTER — Encounter: Payer: Self-pay | Admitting: Cardiology

## 2024-10-06 ENCOUNTER — Ambulatory Visit: Admitting: Cardiology

## 2024-10-28 ENCOUNTER — Ambulatory Visit: Admitting: Emergency Medicine
